# Patient Record
Sex: Male | Born: 1945
Health system: Southern US, Community
[De-identification: ages and names within clinical notes are randomized; demographics above are authoritative.]

## PROBLEM LIST (undated history)

## (undated) DIAGNOSIS — I10 Essential (primary) hypertension: Secondary | ICD-10-CM

## (undated) DIAGNOSIS — D649 Anemia, unspecified: Secondary | ICD-10-CM

## (undated) DIAGNOSIS — R079 Chest pain, unspecified: Secondary | ICD-10-CM

## (undated) DIAGNOSIS — E119 Type 2 diabetes mellitus without complications: Secondary | ICD-10-CM

## (undated) DIAGNOSIS — R0789 Other chest pain: Secondary | ICD-10-CM

## (undated) DIAGNOSIS — E785 Hyperlipidemia, unspecified: Secondary | ICD-10-CM

## (undated) DIAGNOSIS — J309 Allergic rhinitis, unspecified: Secondary | ICD-10-CM

## (undated) DIAGNOSIS — C801 Malignant (primary) neoplasm, unspecified: Secondary | ICD-10-CM

## (undated) DIAGNOSIS — J329 Chronic sinusitis, unspecified: Secondary | ICD-10-CM

## (undated) DIAGNOSIS — D1720 Benign lipomatous neoplasm of skin and subcutaneous tissue of unspecified limb: Secondary | ICD-10-CM

## (undated) HISTORY — PX: COLONOSCOPY: SHX174

## (undated) HISTORY — PX: PROSTATE SURGERY: SHX751

---

## 2005-09-21 ENCOUNTER — Inpatient Hospital Stay: Payer: Self-pay | Admitting: Internal Medicine

## 2005-09-21 ENCOUNTER — Other Ambulatory Visit: Payer: Self-pay

## 2006-03-11 ENCOUNTER — Inpatient Hospital Stay: Payer: Self-pay | Admitting: Urology

## 2006-03-19 ENCOUNTER — Ambulatory Visit: Payer: Self-pay | Admitting: Urology

## 2006-09-04 ENCOUNTER — Ambulatory Visit: Payer: Self-pay | Admitting: Gastroenterology

## 2006-10-06 ENCOUNTER — Ambulatory Visit: Payer: Self-pay

## 2006-12-14 ENCOUNTER — Ambulatory Visit: Payer: Self-pay | Admitting: Radiation Oncology

## 2006-12-21 ENCOUNTER — Ambulatory Visit: Payer: Self-pay | Admitting: Radiation Oncology

## 2007-01-13 ENCOUNTER — Ambulatory Visit: Payer: Self-pay | Admitting: Radiation Oncology

## 2007-02-13 ENCOUNTER — Ambulatory Visit: Payer: Self-pay | Admitting: Radiation Oncology

## 2007-03-15 ENCOUNTER — Ambulatory Visit: Payer: Self-pay | Admitting: Radiation Oncology

## 2007-04-15 ENCOUNTER — Ambulatory Visit: Payer: Self-pay | Admitting: Radiation Oncology

## 2007-06-13 ENCOUNTER — Ambulatory Visit: Payer: Self-pay | Admitting: Radiation Oncology

## 2007-07-15 ENCOUNTER — Ambulatory Visit: Payer: Self-pay | Admitting: Radiation Oncology

## 2007-08-13 ENCOUNTER — Ambulatory Visit: Payer: Self-pay | Admitting: Radiation Oncology

## 2007-12-14 ENCOUNTER — Ambulatory Visit: Payer: Self-pay | Admitting: Radiation Oncology

## 2007-12-30 ENCOUNTER — Ambulatory Visit: Payer: Self-pay | Admitting: Radiation Oncology

## 2008-01-13 ENCOUNTER — Ambulatory Visit: Payer: Self-pay | Admitting: Radiation Oncology

## 2008-12-13 ENCOUNTER — Ambulatory Visit: Payer: Self-pay | Admitting: Radiation Oncology

## 2008-12-27 ENCOUNTER — Ambulatory Visit: Payer: Self-pay | Admitting: Radiation Oncology

## 2009-01-12 ENCOUNTER — Ambulatory Visit: Payer: Self-pay | Admitting: Radiation Oncology

## 2009-04-14 ENCOUNTER — Ambulatory Visit: Payer: Self-pay | Admitting: Radiation Oncology

## 2009-12-13 ENCOUNTER — Ambulatory Visit: Payer: Self-pay | Admitting: Radiation Oncology

## 2009-12-27 ENCOUNTER — Ambulatory Visit: Payer: Self-pay | Admitting: Radiation Oncology

## 2009-12-28 LAB — PSA

## 2010-01-12 ENCOUNTER — Ambulatory Visit: Payer: Self-pay | Admitting: Radiation Oncology

## 2014-02-03 DIAGNOSIS — E119 Type 2 diabetes mellitus without complications: Secondary | ICD-10-CM | POA: Insufficient documentation

## 2014-02-06 ENCOUNTER — Ambulatory Visit: Payer: Self-pay | Admitting: Internal Medicine

## 2015-08-14 DIAGNOSIS — C61 Malignant neoplasm of prostate: Secondary | ICD-10-CM | POA: Insufficient documentation

## 2016-05-09 DIAGNOSIS — E119 Type 2 diabetes mellitus without complications: Secondary | ICD-10-CM | POA: Diagnosis not present

## 2016-05-09 DIAGNOSIS — C61 Malignant neoplasm of prostate: Secondary | ICD-10-CM | POA: Diagnosis not present

## 2016-05-16 DIAGNOSIS — C61 Malignant neoplasm of prostate: Secondary | ICD-10-CM | POA: Diagnosis not present

## 2016-05-16 DIAGNOSIS — D649 Anemia, unspecified: Secondary | ICD-10-CM | POA: Diagnosis not present

## 2016-05-16 DIAGNOSIS — E119 Type 2 diabetes mellitus without complications: Secondary | ICD-10-CM | POA: Diagnosis not present

## 2016-05-16 DIAGNOSIS — E784 Other hyperlipidemia: Secondary | ICD-10-CM | POA: Diagnosis not present

## 2016-05-16 DIAGNOSIS — J329 Chronic sinusitis, unspecified: Secondary | ICD-10-CM | POA: Diagnosis not present

## 2016-05-16 DIAGNOSIS — I10 Essential (primary) hypertension: Secondary | ICD-10-CM | POA: Diagnosis not present

## 2016-06-18 DIAGNOSIS — K006 Disturbances in tooth eruption: Secondary | ICD-10-CM | POA: Diagnosis not present

## 2016-06-30 DIAGNOSIS — K006 Disturbances in tooth eruption: Secondary | ICD-10-CM | POA: Diagnosis not present

## 2016-08-08 DIAGNOSIS — H40033 Anatomical narrow angle, bilateral: Secondary | ICD-10-CM | POA: Diagnosis not present

## 2016-08-11 DIAGNOSIS — D649 Anemia, unspecified: Secondary | ICD-10-CM | POA: Diagnosis not present

## 2016-08-11 DIAGNOSIS — C61 Malignant neoplasm of prostate: Secondary | ICD-10-CM | POA: Diagnosis not present

## 2016-08-11 DIAGNOSIS — E119 Type 2 diabetes mellitus without complications: Secondary | ICD-10-CM | POA: Diagnosis not present

## 2016-08-11 DIAGNOSIS — I10 Essential (primary) hypertension: Secondary | ICD-10-CM | POA: Diagnosis not present

## 2016-08-18 DIAGNOSIS — Z1211 Encounter for screening for malignant neoplasm of colon: Secondary | ICD-10-CM | POA: Diagnosis not present

## 2016-08-18 DIAGNOSIS — E784 Other hyperlipidemia: Secondary | ICD-10-CM | POA: Diagnosis not present

## 2016-08-18 DIAGNOSIS — E119 Type 2 diabetes mellitus without complications: Secondary | ICD-10-CM | POA: Diagnosis not present

## 2016-08-18 DIAGNOSIS — I10 Essential (primary) hypertension: Secondary | ICD-10-CM | POA: Diagnosis not present

## 2016-08-18 DIAGNOSIS — C61 Malignant neoplasm of prostate: Secondary | ICD-10-CM | POA: Diagnosis not present

## 2016-08-18 DIAGNOSIS — Z0000 Encounter for general adult medical examination without abnormal findings: Secondary | ICD-10-CM | POA: Diagnosis not present

## 2016-08-18 DIAGNOSIS — D649 Anemia, unspecified: Secondary | ICD-10-CM | POA: Diagnosis not present

## 2016-08-18 DIAGNOSIS — J329 Chronic sinusitis, unspecified: Secondary | ICD-10-CM | POA: Diagnosis not present

## 2016-11-12 DIAGNOSIS — E119 Type 2 diabetes mellitus without complications: Secondary | ICD-10-CM | POA: Diagnosis not present

## 2016-11-12 DIAGNOSIS — I10 Essential (primary) hypertension: Secondary | ICD-10-CM | POA: Diagnosis not present

## 2016-11-12 DIAGNOSIS — C61 Malignant neoplasm of prostate: Secondary | ICD-10-CM | POA: Diagnosis not present

## 2016-11-19 DIAGNOSIS — C61 Malignant neoplasm of prostate: Secondary | ICD-10-CM | POA: Diagnosis not present

## 2016-11-19 DIAGNOSIS — I10 Essential (primary) hypertension: Secondary | ICD-10-CM | POA: Diagnosis not present

## 2016-11-19 DIAGNOSIS — E784 Other hyperlipidemia: Secondary | ICD-10-CM | POA: Diagnosis not present

## 2016-11-19 DIAGNOSIS — D649 Anemia, unspecified: Secondary | ICD-10-CM | POA: Diagnosis not present

## 2016-11-19 DIAGNOSIS — E119 Type 2 diabetes mellitus without complications: Secondary | ICD-10-CM | POA: Diagnosis not present

## 2017-01-22 DIAGNOSIS — Z1211 Encounter for screening for malignant neoplasm of colon: Secondary | ICD-10-CM | POA: Diagnosis not present

## 2017-01-22 DIAGNOSIS — I10 Essential (primary) hypertension: Secondary | ICD-10-CM | POA: Diagnosis not present

## 2017-02-06 DIAGNOSIS — H40033 Anatomical narrow angle, bilateral: Secondary | ICD-10-CM | POA: Diagnosis not present

## 2017-02-06 DIAGNOSIS — E119 Type 2 diabetes mellitus without complications: Secondary | ICD-10-CM | POA: Diagnosis not present

## 2017-04-21 ENCOUNTER — Encounter: Payer: Self-pay | Admitting: Emergency Medicine

## 2017-04-22 ENCOUNTER — Encounter: Payer: Self-pay | Admitting: *Deleted

## 2017-04-22 ENCOUNTER — Encounter
Admission: RE | Disposition: A | Discharge status: Home / Self Care | Payer: Self-pay | Source: Ambulatory Visit | Attending: Unknown Physician Specialty

## 2017-04-22 ENCOUNTER — Ambulatory Visit: Payer: Medicare HMO | Admitting: Anesthesiology

## 2017-04-22 ENCOUNTER — Ambulatory Visit
Admission: RE | Admit: 2017-04-22 | Discharge: 2017-04-22 | Disposition: A | Discharge status: Home / Self Care | Payer: Medicare HMO | Source: Ambulatory Visit | Attending: Unknown Physician Specialty | Admitting: Unknown Physician Specialty

## 2017-04-22 DIAGNOSIS — Z7984 Long term (current) use of oral hypoglycemic drugs: Secondary | ICD-10-CM | POA: Insufficient documentation

## 2017-04-22 DIAGNOSIS — K648 Other hemorrhoids: Secondary | ICD-10-CM | POA: Diagnosis not present

## 2017-04-22 DIAGNOSIS — Z87891 Personal history of nicotine dependence: Secondary | ICD-10-CM | POA: Diagnosis not present

## 2017-04-22 DIAGNOSIS — D123 Benign neoplasm of transverse colon: Secondary | ICD-10-CM | POA: Insufficient documentation

## 2017-04-22 DIAGNOSIS — Z79899 Other long term (current) drug therapy: Secondary | ICD-10-CM | POA: Diagnosis not present

## 2017-04-22 DIAGNOSIS — Z7982 Long term (current) use of aspirin: Secondary | ICD-10-CM | POA: Diagnosis not present

## 2017-04-22 DIAGNOSIS — E119 Type 2 diabetes mellitus without complications: Secondary | ICD-10-CM | POA: Insufficient documentation

## 2017-04-22 DIAGNOSIS — K635 Polyp of colon: Secondary | ICD-10-CM | POA: Diagnosis not present

## 2017-04-22 DIAGNOSIS — Z1211 Encounter for screening for malignant neoplasm of colon: Secondary | ICD-10-CM | POA: Diagnosis not present

## 2017-04-22 DIAGNOSIS — D122 Benign neoplasm of ascending colon: Secondary | ICD-10-CM | POA: Diagnosis not present

## 2017-04-22 DIAGNOSIS — K640 First degree hemorrhoids: Secondary | ICD-10-CM | POA: Diagnosis not present

## 2017-04-22 DIAGNOSIS — I10 Essential (primary) hypertension: Secondary | ICD-10-CM | POA: Diagnosis not present

## 2017-04-22 DIAGNOSIS — D649 Anemia, unspecified: Secondary | ICD-10-CM | POA: Diagnosis not present

## 2017-04-22 DIAGNOSIS — E785 Hyperlipidemia, unspecified: Secondary | ICD-10-CM | POA: Diagnosis not present

## 2017-04-22 HISTORY — DX: Chest pain, unspecified: R07.9

## 2017-04-22 HISTORY — DX: Allergic rhinitis, unspecified: J30.9

## 2017-04-22 HISTORY — DX: Hyperlipidemia, unspecified: E78.5

## 2017-04-22 HISTORY — DX: Chronic sinusitis, unspecified: J32.9

## 2017-04-22 HISTORY — PX: COLONOSCOPY WITH PROPOFOL: SHX5780

## 2017-04-22 HISTORY — DX: Type 2 diabetes mellitus without complications: E11.9

## 2017-04-22 HISTORY — DX: Anemia, unspecified: D64.9

## 2017-04-22 HISTORY — DX: Essential (primary) hypertension: I10

## 2017-04-22 HISTORY — DX: Other chest pain: R07.89

## 2017-04-22 HISTORY — DX: Benign lipomatous neoplasm of skin and subcutaneous tissue of unspecified limb: D17.20

## 2017-04-22 HISTORY — DX: Malignant (primary) neoplasm, unspecified: C80.1

## 2017-04-22 LAB — GLUCOSE, CAPILLARY: Glucose-Capillary: 115 mg/dL — ABNORMAL HIGH (ref 65–99)

## 2017-04-22 SURGERY — COLONOSCOPY WITH PROPOFOL
Anesthesia: General

## 2017-04-22 MED ORDER — FENTANYL CITRATE (PF) 100 MCG/2ML IJ SOLN
INTRAMUSCULAR | Status: AC
Start: 2017-04-22 — End: 2017-04-22
  Filled 2017-04-22: qty 2

## 2017-04-22 MED ORDER — PROPOFOL 10 MG/ML IV BOLUS
INTRAVENOUS | Status: AC
Start: 2017-04-22 — End: 2017-04-22
  Filled 2017-04-22: qty 20

## 2017-04-22 MED ORDER — LIDOCAINE HCL (CARDIAC) 20 MG/ML IV SOLN
INTRAVENOUS | Status: DC | PRN
  Administered 2017-04-22: 30 mg via INTRAVENOUS

## 2017-04-22 MED ORDER — LIDOCAINE HCL (PF) 2 % IJ SOLN
INTRAMUSCULAR | Status: AC
  Filled 2017-04-22: qty 10

## 2017-04-22 MED ORDER — FENTANYL CITRATE (PF) 100 MCG/2ML IJ SOLN: 25.0000 ug | INTRAMUSCULAR | Status: DC | PRN

## 2017-04-22 MED ORDER — ONDANSETRON HCL 4 MG/2ML IJ SOLN: 4.0000 mg | Freq: Once | INTRAMUSCULAR | Status: DC | PRN

## 2017-04-22 MED ORDER — PROPOFOL 500 MG/50ML IV EMUL
INTRAVENOUS | Status: AC
  Filled 2017-04-22: qty 50

## 2017-04-22 MED ORDER — SODIUM CHLORIDE 0.9 % IV SOLN
INTRAVENOUS | Status: DC
  Administered 2017-04-22: 08:00:00 via INTRAVENOUS

## 2017-04-22 MED ORDER — PROPOFOL 500 MG/50ML IV EMUL
INTRAVENOUS | Status: DC | PRN
  Administered 2017-04-22: 140 ug/kg/min via INTRAVENOUS

## 2017-04-22 MED ORDER — EPHEDRINE SULFATE 50 MG/ML IJ SOLN
INTRAMUSCULAR | Status: AC
  Filled 2017-04-22: qty 1

## 2017-04-22 MED ORDER — PROPOFOL 10 MG/ML IV BOLUS
INTRAVENOUS | Status: DC | PRN
  Administered 2017-04-22: 100 mg via INTRAVENOUS

## 2017-04-22 MED ORDER — PHENYLEPHRINE HCL 10 MG/ML IJ SOLN
INTRAMUSCULAR | Status: AC
  Filled 2017-04-22: qty 1

## 2017-04-22 MED ORDER — SODIUM CHLORIDE 0.9 % IJ SOLN
INTRAMUSCULAR | Status: AC
  Filled 2017-04-22: qty 10

## 2017-04-22 MED ORDER — FENTANYL CITRATE (PF) 100 MCG/2ML IJ SOLN
INTRAMUSCULAR | Status: DC | PRN
  Administered 2017-04-22 (×2): 50 ug via INTRAVENOUS

## 2017-04-22 NOTE — Anesthesia Preprocedure Evaluation (Signed)
Anesthesia Evaluation  Patient identified by MRN, date of birth, ID band Patient awake    Reviewed: Allergy & Precautions, NPO status , Patient's Chart, lab work & pertinent test results  Airway Mallampati: II       Dental   Pulmonary former smoker,    Pulmonary exam normal        Cardiovascular hypertension, Pt. on medications Normal cardiovascular exam     Neuro/Psych negative neurological ROS  negative psych ROS   GI/Hepatic negative GI ROS, Neg liver ROS,   Endo/Other  diabetes, Well Controlled, Type 2, Oral Hypoglycemic Agents  Renal/GU negative Renal ROS  negative genitourinary   Musculoskeletal negative musculoskeletal ROS (+)   Abdominal Normal abdominal exam  (+)   Peds negative pediatric ROS (+)  Hematology  (+) anemia ,   Anesthesia Other Findings   Reproductive/Obstetrics                             Anesthesia Physical Anesthesia Plan  ASA: III  Anesthesia Plan: General   Post-op Pain Management:    Induction: Intravenous  PONV Risk Score and Plan:   Airway Management Planned: Nasal Cannula  Additional Equipment:   Intra-op Plan:   Post-operative Plan:   Informed Consent: I have reviewed the patients History and Physical, chart, labs and discussed the procedure including the risks, benefits and alternatives for the proposed anesthesia with the patient or authorized representative who has indicated his/her understanding and acceptance.   Dental advisory given  Plan Discussed with: CRNA and Surgeon  Anesthesia Plan Comments:         Anesthesia Quick Evaluation

## 2017-04-22 NOTE — Op Note (Signed)
[facility] Gastroenterology Patient Name: Nicholas Schneider Procedure Date: 04/22/2017 7:29 AM MRN: [id number] Account #: [id number] Date of Birth: 1946-04-08 Admit Type: Outpatient Age: 72 Room: [facility] ENDO ROOM 4 Gender: Male Note Status: Finalized Procedure:            Colonoscopy Indications:          Screening for colorectal malignant neoplasm Providers:            Manya Silvas, MD Referring MD:         Ramonita Lab, MD (Referring MD) Medicines:            Propofol per Anesthesia Complications:        No immediate complications. Procedure:            Pre-Anesthesia Assessment:                       - After reviewing the risks and benefits, the patient                        was deemed in satisfactory condition to undergo the                        procedure.                       After obtaining informed consent, the colonoscope was                        passed under direct vision. Throughout the procedure,                        the patient's blood pressure, pulse, and oxygen                        saturations were monitored continuously. The                        Colonoscope was introduced through the anus and                        advanced to the the cecum, identified by appendiceal                        orifice and ileocecal valve. The colonoscopy was                        performed without difficulty. The patient tolerated the                        procedure well. The quality of the bowel preparation                        was good. Findings:      A diminutive polyp was found in the ascending colon. The polyp was       sessile. The polyp was removed with a jumbo cold forceps. Resection and       retrieval were complete.      A small polyp was found in the transverse colon. The polyp was sessile.       The polyp was removed with a cold snare. Resection and retrieval were  complete.      Internal hemorrhoids were found during endoscopy. The  hemorrhoids were       small and Grade I (internal hemorrhoids that do not prolapse).      The exam was otherwise without abnormality. Impression:           - One diminutive polyp in the ascending colon, removed                        with a jumbo cold forceps. Resected and retrieved.                       - One small polyp in the transverse colon, removed with                        a cold snare. Resected and retrieved.                       - Internal hemorrhoids.                       - The examination was otherwise normal. Recommendation:       - Await pathology results. Manya Silvas, MD 04/22/2017 8:03:52 AM This report has been signed electronically. Number of Addenda: 0 Note Initiated On: 04/22/2017 7:29 AM Scope Withdrawal Time: 0 hours 11 minutes 32 seconds  Total Procedure Duration: 0 hours 18 minutes 14 seconds       [facility]

## 2017-04-22 NOTE — Anesthesia Postprocedure Evaluation (Signed)
Anesthesia Post Note  Patient: Nicholas Schneider  Procedure(s) Performed: COLONOSCOPY WITH PROPOFOL (N/A )  Patient location during evaluation: PACU Anesthesia Type: General Level of consciousness: awake and alert and oriented Pain management: pain level controlled Vital Signs Assessment: post-procedure vital signs reviewed and stable Respiratory status: spontaneous breathing Cardiovascular status: blood pressure returned to baseline Anesthetic complications: no     Last Vitals:  Vitals:   04/22/17 0826 04/22/17 0836  BP: (!) 154/88 (!) 156/85  Pulse: (!) 58 63  Resp: 20 20  Temp:    SpO2: 99% 99%    Last Pain:  Vitals:   04/22/17 0806  TempSrc: Tympanic                 [name]

## 2017-04-22 NOTE — Transfer of Care (Signed)
Immediate Anesthesia Transfer of Care Note  Patient: Nicholas Schneider  Procedure(s) Performed: COLONOSCOPY WITH PROPOFOL (N/A )  Patient Location: PACU and Endoscopy Unit  Anesthesia Type:General  Level of Consciousness: awake, oriented and patient cooperative  Airway & Oxygen Therapy: Patient Spontanous Breathing and Patient connected to nasal cannula oxygen  Post-op Assessment: Report given to RN and Post -op Vital signs reviewed and stable  Post vital signs: Reviewed and stable  Last Vitals:  Vitals:   04/22/17 0705  BP: (!) 165/82  Pulse: 73  Resp: 18  Temp: (!) 36.1 C  SpO2: 97%    Last Pain:  Vitals:   04/22/17 0705  TempSrc: Tympanic         Complications: No apparent anesthesia complications

## 2017-04-22 NOTE — Anesthesia Post-op Follow-up Note (Signed)
Anesthesia QCDR form completed.        

## 2017-04-22 NOTE — H&P (Signed)
Primary Care Physician:  Adin Hector, MD Primary Gastroenterologist:  Dr. Vira Agar  Pre-Procedure History & Physical: HPI:  Nicholas Schneider is a 72 y.o. male is here for an colonoscopy.   Past Medical History:  Diagnosis Date  . Allergic rhinitis   . Anemia   . Cancer ([place])   . Chest pain of uncertain etiology   . Diabetes mellitus without complication ([place])   . Hyperlipidemia   . Hypertension   . Lipoma of arm   . Recurrent sinusitis     Past Surgical History:  Procedure Laterality Date  . COLONOSCOPY    . PROSTATE SURGERY      Prior to Admission medications   Medication Sig Start Date End Date Taking? Authorizing Provider  aspirin 81 MG EC tablet Take 81 mg by mouth daily. Swallow whole.   Yes [provider]  azelastine (ASTELIN) 0.1 % nasal spray Place 1 spray into both nostrils 2 (two) times daily. Use in each nostril as directed   Yes [provider]  cloNIDine (CATAPRES) 0.1 MG tablet Take 0.1 mg by mouth 2 (two) times daily.   Yes [provider]  glipiZIDE (GLUCOTROL) 10 MG tablet Take 10 mg by mouth daily.   Yes [provider]  hydrochlorothiazide (HYDRODIURIL) 25 MG tablet Take 25 mg by mouth daily.   Yes [provider]  metFORMIN (GLUCOPHAGE) 1000 MG tablet Take 1,000 mg by mouth 2 (two) times daily with a meal.   Yes [provider]  Multiple Vitamin (MULTIVITAMIN) tablet Take 1 tablet by mouth daily.   Yes [provider]  quinapril (ACCUPRIL) 20 MG tablet Take 20 mg by mouth 2 (two) times daily.   Yes [provider]  rosuvastatin (CRESTOR) 10 MG tablet Take 10 mg by mouth daily.   Yes [provider]  simvastatin (ZOCOR) 20 MG tablet Take 20 mg by mouth daily.   Yes [provider]  verapamil (CALAN-SR) 240 MG CR tablet Take 240 mg by mouth at bedtime.   Yes [provider]  azithromycin (ZITHROMAX) 250 MG tablet Take 500 mg by mouth daily.    [provider]  ranitidine (ZANTAC) 150 MG tablet Take 150 mg by mouth daily as needed for heartburn.    [provider]    Allergies as of 03/02/2017  . (Not on File)    History reviewed. No pertinent family history.  Social History   Socioeconomic History  . Marital status: Married    Spouse name: Not on file  . Number of children: Not on file  . Years of education: Not on file  . Highest education level: Not on file  Social Needs  . Financial resource strain: Not on file  . Food insecurity - worry: Not on file  . Food insecurity - inability: Not on file  . Transportation needs - medical: Not on file  . Transportation needs - non-medical: Not on file  Occupational History  . Not on file  Tobacco Use  . Smoking status: Former Smoker    Last attempt to quit: 04/22/1991    Years since quitting: 26.0  . Smokeless tobacco: Never Used  Substance and Sexual Activity  . Alcohol use: No    Frequency: Never  . Drug use: No  . Sexual activity: Not on file  Other Topics Concern  . Not on file  Social History Narrative  . Not on file    Review of Systems: See HPI, otherwise  negative ROS  Physical Exam: BP (!) 165/82   Pulse 73   Temp (!) 97 F (36.1 C) (Tympanic)   Resp 18   Ht 5' 4.5" (1.638 m)   Wt 96.6 kg (213 lb)   SpO2 97%   BMI 36.00 kg/m  General:   Alert,  pleasant and cooperative in NAD Head:  Normocephalic and atraumatic. Neck:  Supple; no masses or thyromegaly. Lungs:  Clear throughout to auscultation.    Heart:  Regular rate and rhythm. Abdomen:  Soft, nontender and nondistended. Normal bowel sounds, without guarding, and without rebound.   Neurologic:  Alert and  oriented x4;  grossly normal neurologically.  Impression/Plan: Nicholas Schneider is here for an colonoscopy to be performed for colon cancer screening.  Risks, benefits, limitations, and alternatives regarding  colonoscopy have been reviewed with the patient.  Questions have been  answered.  All parties agreeable.   Gaylyn Cheers, MD  04/22/2017, 7:25 AM

## 2017-04-23 ENCOUNTER — Encounter: Payer: Self-pay | Admitting: Unknown Physician Specialty

## 2017-04-23 LAB — SURGICAL PATHOLOGY

## 2017-05-15 DIAGNOSIS — H40033 Anatomical narrow angle, bilateral: Secondary | ICD-10-CM | POA: Diagnosis not present

## 2017-05-18 DIAGNOSIS — C61 Malignant neoplasm of prostate: Secondary | ICD-10-CM | POA: Diagnosis not present

## 2017-05-18 DIAGNOSIS — E7849 Other hyperlipidemia: Secondary | ICD-10-CM | POA: Diagnosis not present

## 2017-05-18 DIAGNOSIS — E119 Type 2 diabetes mellitus without complications: Secondary | ICD-10-CM | POA: Diagnosis not present

## 2017-05-18 DIAGNOSIS — D649 Anemia, unspecified: Secondary | ICD-10-CM | POA: Diagnosis not present

## 2017-05-18 DIAGNOSIS — I10 Essential (primary) hypertension: Secondary | ICD-10-CM | POA: Diagnosis not present

## 2017-05-25 DIAGNOSIS — E119 Type 2 diabetes mellitus without complications: Secondary | ICD-10-CM | POA: Diagnosis not present

## 2017-05-25 DIAGNOSIS — D649 Anemia, unspecified: Secondary | ICD-10-CM | POA: Diagnosis not present

## 2017-05-25 DIAGNOSIS — E7849 Other hyperlipidemia: Secondary | ICD-10-CM | POA: Diagnosis not present

## 2017-05-25 DIAGNOSIS — C61 Malignant neoplasm of prostate: Secondary | ICD-10-CM | POA: Diagnosis not present

## 2017-05-25 DIAGNOSIS — I10 Essential (primary) hypertension: Secondary | ICD-10-CM | POA: Diagnosis not present

## 2017-05-28 ENCOUNTER — Encounter: Payer: Self-pay | Admitting: Urology

## 2017-05-28 ENCOUNTER — Ambulatory Visit: Payer: Medicare HMO | Admitting: Urology

## 2017-05-28 VITALS — BP 174/75 | HR 82 | Ht 64.0 in | Wt 213.0 lb

## 2017-05-28 DIAGNOSIS — R972 Elevated prostate specific antigen [PSA]: Secondary | ICD-10-CM

## 2017-05-28 DIAGNOSIS — C61 Malignant neoplasm of prostate: Secondary | ICD-10-CM | POA: Diagnosis not present

## 2017-05-28 NOTE — Progress Notes (Signed)
05/28/2017 10:43 AM   Nicholas Schneider 03-Jan-1946 [id number]  Referring provider: Adin Hector, MD [facility] [place], [place] [address]  Chief Complaint  Patient presents with  . Elevated PSA    New patient    HPI: Nicholas Schneider is a 72 year old male with a history of prostate cancer seen at the request of Dr. Caryl Comes for a rising PSA.  His previous records are not available to review however I saw him in 2007/2008 and he underwent a radical prostatectomy.  He had positive margins and a detectable PSA and underwent radiation therapy approximately 3 months after his surgery.  He had a PSA nadir of <0.1 that persisted till at least 2011. A PSA in April 2016 was 0.11 and this has slowly risen and was most recently 0.36 on 05/18/2017.  He denies lower urinary tract symptoms or gross hematuria.  He denies flank, abdominal, pelvic or scrotal pain.  He has no bony pain.  PMH: Past Medical History:  Diagnosis Date  . Allergic rhinitis   . Anemia   . Cancer ([place])   . Chest pain of uncertain etiology   . Diabetes mellitus without complication ([place])   . Hyperlipidemia   . Hypertension   . Lipoma of arm   . Recurrent sinusitis     Surgical History: Past Surgical History:  Procedure Laterality Date  . COLONOSCOPY    . COLONOSCOPY WITH PROPOFOL N/A 04/22/2017   Procedure: COLONOSCOPY WITH PROPOFOL;  Surgeon: Manya Silvas, MD;  Location: [facility] ENDOSCOPY;  Service: Endoscopy;  Laterality: N/A;  . PROSTATE SURGERY      Home Medications:  Allergies as of 05/28/2017   No Known Allergies     Medication List        Accurate as of 05/28/17 10:43 AM. Always use your most recent med list.          aspirin 81 MG EC tablet Take 81 mg by mouth daily. Swallow whole.   azelastine 0.1 % nasal spray Commonly known as:  ASTELIN Place 1 spray into both nostrils 2 (two) times daily. Use in each nostril as directed   benzonatate 200 MG capsule Commonly known  as:  TESSALON TAKE 1 CAPSULE BY MOUTH EVERY DAY 3 TIMES A DAY AS NEEDED FOR COUGH   cloNIDine 0.1 MG tablet Commonly known as:  CATAPRES Take 0.1 mg by mouth 2 (two) times daily.   glipiZIDE 10 MG tablet Commonly known as:  GLUCOTROL Take 10 mg by mouth daily.   hydrochlorothiazide 25 MG tablet Commonly known as:  HYDRODIURIL Take 25 mg by mouth daily.   metFORMIN 1000 MG tablet Commonly known as:  GLUCOPHAGE Take 1,000 mg by mouth 2 (two) times daily with a meal.   multivitamin tablet Take 1 tablet by mouth daily.   quinapril 20 MG tablet Commonly known as:  ACCUPRIL Take 20 mg by mouth 2 (two) times daily.   ranitidine 150 MG tablet Commonly known as:  ZANTAC Take 150 mg by mouth daily as needed for heartburn.   rosuvastatin 10 MG tablet Commonly known as:  CRESTOR Take 10 mg by mouth daily.   simvastatin 20 MG tablet Commonly known as:  ZOCOR Take 20 mg by mouth daily.   verapamil 240 MG CR tablet Commonly known as:  CALAN-SR Take 240 mg by mouth at bedtime.       Allergies: No Known Allergies  Family History: No family history on file.  Social History:  reports that he quit smoking about 26 years ago. he has never used smokeless tobacco. He reports that he does not drink alcohol or use drugs.  ROS: UROLOGY Frequent Urination?: No Hard to postpone urination?: No Burning/pain with urination?: No Get up at night to urinate?: No Leakage of urine?: Yes Urine stream starts and stops?: No Trouble starting stream?: No Do you have to strain to urinate?: No Blood in urine?: No Urinary tract infection?: No Sexually transmitted disease?: No Injury to kidneys or bladder?: No Painful intercourse?: No Weak stream?: No Erection problems?: No Penile pain?: No  Gastrointestinal Nausea?: No Vomiting?: No Indigestion/heartburn?: No Diarrhea?: No Constipation?: No  Constitutional Fever: No Night sweats?: No Weight loss?: No Fatigue?: No  Skin Skin  rash/lesions?: No Itching?: No  Eyes Blurred vision?: No Double vision?: No  Ears/Nose/Throat Sore throat?: No Sinus problems?: No  Hematologic/Lymphatic Swollen glands?: No Easy bruising?: No  Cardiovascular Leg swelling?: No Chest pain?: No  Respiratory Cough?: No Shortness of breath?: No  Endocrine Excessive thirst?: No  Musculoskeletal Back pain?: No Joint pain?: No  Neurological Headaches?: No Dizziness?: No  Psychologic Depression?: No Anxiety?: No  Physical Exam: BP (!) 174/75   Pulse 82   Ht 5\' 4"  (1.626 m)   Wt 213 lb (96.6 kg)   BMI 36.56 kg/m   Constitutional:  Alert and oriented, No acute distress. HEENT: [place] AT, moist mucus membranes.  Trachea midline, no masses. Cardiovascular: No clubbing, cyanosis, or edema. Respiratory: Normal respiratory effort, no increased work of breathing. GI: Abdomen is soft, nontender, nondistended, no abdominal masses GU: No CVA tenderness. Skin: No rashes, bruises or suspicious lesions. Lymph: No cervical or inguinal adenopathy. Neurologic: Grossly intact, no focal deficits, moving all 4 extremities. Psychiatric: Normal mood and affect.   Assessment & Plan:  72 year old male with a history of prostate cancer status post radical prostatectomy and adjuvant radiotherapy.  He has a slowly rising PSA.  He is asymptomatic.  Will schedule a CT of the pelvis with contrast to evaluate for objective evidence of recurrent disease.  If negative we discussed options of surveillance versus starting hormonal therapy.   1. Elevated PSA  2. Prostate cancer [facility])  - CT Abdomen Pelvis W Contrast; Future   Return in about 6 months (around 11/25/2017) for Recheck, PSA.  Abbie Sons, [place] [address], [place], [place] [address] [phone]

## 2017-05-29 ENCOUNTER — Telehealth: Payer: Self-pay | Admitting: Urology

## 2017-05-29 NOTE — Telephone Encounter (Signed)
I just wanted to give you a heads up on this Dr. Bernardo Heater ordered a CT scan for this patient but because he is not in the database for her insurance I had to get it approved under your name. The report may get sent to you for review, if you get it can you please forward it over to him? I will have to call and try and get him added to the website for her insurance.   Thanks, Sharyn Lull

## 2017-06-11 ENCOUNTER — Ambulatory Visit
Admission: RE | Admit: 2017-06-11 | Discharge: 2017-06-11 | Disposition: A | Discharge status: Home / Self Care | Payer: Medicare HMO | Source: Ambulatory Visit | Attending: Urology | Admitting: Urology

## 2017-06-11 DIAGNOSIS — D4412 Neoplasm of uncertain behavior of left adrenal gland: Secondary | ICD-10-CM | POA: Diagnosis not present

## 2017-06-11 DIAGNOSIS — C61 Malignant neoplasm of prostate: Secondary | ICD-10-CM | POA: Insufficient documentation

## 2017-06-11 DIAGNOSIS — E278 Other specified disorders of adrenal gland: Secondary | ICD-10-CM | POA: Insufficient documentation

## 2017-06-11 DIAGNOSIS — Z9079 Acquired absence of other genital organ(s): Secondary | ICD-10-CM | POA: Diagnosis not present

## 2017-06-11 MED ORDER — IOPAMIDOL (ISOVUE-300) INJECTION 61%
100.0000 mL | Freq: Once | INTRAVENOUS | Status: AC | PRN
  Administered 2017-06-11: 100 mL via INTRAVENOUS

## 2017-06-16 ENCOUNTER — Telehealth: Payer: Self-pay

## 2017-06-16 NOTE — Telephone Encounter (Signed)
-----   Message from Abbie Sons, MD sent at 06/12/2017  9:35 AM EST ----- CT scan showed no evidence of recurrent disease or enlarged lymph nodes.  Would recommend continued monitoring with a follow-up PSA/office visit June 2019

## 2017-06-16 NOTE — Telephone Encounter (Signed)
Letter sent.

## 2017-08-25 DIAGNOSIS — D649 Anemia, unspecified: Secondary | ICD-10-CM | POA: Diagnosis not present

## 2017-08-25 DIAGNOSIS — E119 Type 2 diabetes mellitus without complications: Secondary | ICD-10-CM | POA: Diagnosis not present

## 2017-09-01 DIAGNOSIS — D649 Anemia, unspecified: Secondary | ICD-10-CM | POA: Diagnosis not present

## 2017-09-01 DIAGNOSIS — B351 Tinea unguium: Secondary | ICD-10-CM | POA: Diagnosis not present

## 2017-09-01 DIAGNOSIS — E119 Type 2 diabetes mellitus without complications: Secondary | ICD-10-CM | POA: Diagnosis not present

## 2017-09-01 DIAGNOSIS — I10 Essential (primary) hypertension: Secondary | ICD-10-CM | POA: Diagnosis not present

## 2017-09-01 DIAGNOSIS — E7849 Other hyperlipidemia: Secondary | ICD-10-CM | POA: Diagnosis not present

## 2017-09-01 DIAGNOSIS — Z0000 Encounter for general adult medical examination without abnormal findings: Secondary | ICD-10-CM | POA: Diagnosis not present

## 2017-09-01 DIAGNOSIS — C61 Malignant neoplasm of prostate: Secondary | ICD-10-CM | POA: Diagnosis not present

## 2017-09-01 DIAGNOSIS — E1169 Type 2 diabetes mellitus with other specified complication: Secondary | ICD-10-CM

## 2017-10-17 DIAGNOSIS — L298 Other pruritus: Secondary | ICD-10-CM | POA: Diagnosis not present

## 2017-10-18 ENCOUNTER — Emergency Department
Admission: EM | Admit: 2017-10-18 | Discharge: 2017-10-18 | Disposition: A | Discharge status: Home / Self Care | Payer: Medicare HMO | Attending: Emergency Medicine | Admitting: Emergency Medicine

## 2017-10-18 ENCOUNTER — Other Ambulatory Visit: Payer: Self-pay

## 2017-10-18 DIAGNOSIS — Z87891 Personal history of nicotine dependence: Secondary | ICD-10-CM | POA: Insufficient documentation

## 2017-10-18 DIAGNOSIS — T783XXA Angioneurotic edema, initial encounter: Secondary | ICD-10-CM | POA: Insufficient documentation

## 2017-10-18 DIAGNOSIS — I10 Essential (primary) hypertension: Secondary | ICD-10-CM | POA: Diagnosis not present

## 2017-10-18 DIAGNOSIS — E119 Type 2 diabetes mellitus without complications: Secondary | ICD-10-CM | POA: Insufficient documentation

## 2017-10-18 DIAGNOSIS — Z859 Personal history of malignant neoplasm, unspecified: Secondary | ICD-10-CM | POA: Diagnosis not present

## 2017-10-18 LAB — CBC WITH DIFFERENTIAL/PLATELET
Basophils Absolute: 0.1 10*3/uL (ref 0–0.1)
Basophils Relative: 1 %
Eosinophils Absolute: 0.2 10*3/uL (ref 0–0.7)
Eosinophils Relative: 3 %
HCT: 42.8 % (ref 40.0–52.0)
HEMOGLOBIN: 14.7 g/dL (ref 13.0–18.0)
LYMPHS ABS: 2.2 10*3/uL (ref 1.0–3.6)
LYMPHS PCT: 31 %
MCH: 30.5 pg (ref 26.0–34.0)
MCHC: 34.4 g/dL (ref 32.0–36.0)
MCV: 88.5 fL (ref 80.0–100.0)
Monocytes Absolute: 0.8 10*3/uL (ref 0.2–1.0)
Monocytes Relative: 11 %
NEUTROS PCT: 54 %
Neutro Abs: 3.9 10*3/uL (ref 1.4–6.5)
Platelets: 288 10*3/uL (ref 150–440)
RBC: 4.84 MIL/uL (ref 4.40–5.90)
RDW: 14.1 % (ref 11.5–14.5)
WBC: 7.1 10*3/uL (ref 3.8–10.6)

## 2017-10-18 LAB — COMPREHENSIVE METABOLIC PANEL
ALK PHOS: 57 U/L (ref 38–126)
ALT: 20 U/L (ref 0–44)
AST: 21 U/L (ref 15–41)
Albumin: 4.2 g/dL (ref 3.5–5.0)
Anion gap: 7 (ref 5–15)
BUN: 16 mg/dL (ref 8–23)
CALCIUM: 9.1 mg/dL (ref 8.9–10.3)
CO2: 29 mmol/L (ref 22–32)
CREATININE: 0.86 mg/dL (ref 0.61–1.24)
Chloride: 102 mmol/L (ref 98–111)
Glucose, Bld: 188 mg/dL — ABNORMAL HIGH (ref 70–99)
Potassium: 3.8 mmol/L (ref 3.5–5.1)
Sodium: 138 mmol/L (ref 135–145)
Total Bilirubin: 0.6 mg/dL (ref 0.3–1.2)
Total Protein: 7.6 g/dL (ref 6.5–8.1)

## 2017-10-18 LAB — TROPONIN I: Troponin I: <0.03 ng/mL (ref <0.03)

## 2017-10-18 MED ORDER — PREDNISONE 10 MG (21) PO TBPK: ORAL_TABLET | Freq: Every day | ORAL | 0 refills | Status: DC

## 2017-10-18 MED ORDER — DIPHENHYDRAMINE HCL 50 MG/ML IJ SOLN
25.0000 mg | Freq: Once | INTRAMUSCULAR | Status: AC
  Administered 2017-10-18: 25 mg via INTRAVENOUS
  Filled 2017-10-18: qty 1

## 2017-10-18 MED ORDER — IPRATROPIUM-ALBUTEROL 0.5-2.5 (3) MG/3ML IN SOLN
3.0000 mL | Freq: Once | RESPIRATORY_TRACT | Status: AC
  Administered 2017-10-18: 3 mL via RESPIRATORY_TRACT
  Filled 2017-10-18: qty 3

## 2017-10-18 NOTE — ED Triage Notes (Signed)
Pt reports noticing a couple areas of swelling and itching on the top of his head a week ago, pt went to the walk in clinic yesterday for large hive to the left upper arm and was prescribed topical benadryl, pt woke up this am with the left side of his tongue swollen and left side of throat feeling swollen, pt denies taking any medication this am

## 2017-10-18 NOTE — ED Provider Notes (Signed)
[facility] Emergency Department Provider Note       Time seen: ----------------------------------------- 8:06 AM on 10/18/2017 -----------------------------------------   I have reviewed the triage vital signs and the nursing notes.  HISTORY   Chief Complaint Angioedema    HPI Nicholas Schneider is a 72 y.o. male with a history of anemia, cancer, diabetes, hyperlipidemia, hypertension who presents to the ED for areas of swelling and itching on the top of his head a week ago.  Patient went to the walk-in clinic yesterday for some hives to his left upper arm.  He was prescribed topical Benadryl.  He reports he woke up this morning with left side of his tongue being swollen left side of his throat feeling swollen.  Elastic all of his medications yesterday.  He does take an ACE inhibitor for hypertension.  Past Medical History:  Diagnosis Date  . Allergic rhinitis   . Anemia   . Cancer ([place])   . Chest pain of uncertain etiology   . Diabetes mellitus without complication ([place])   . Hyperlipidemia   . Hypertension   . Lipoma of arm   . Recurrent sinusitis     There are no active problems to display for this patient.   Past Surgical History:  Procedure Laterality Date  . COLONOSCOPY    . COLONOSCOPY WITH PROPOFOL N/A 04/22/2017   Procedure: COLONOSCOPY WITH PROPOFOL;  Surgeon: Manya Silvas, MD;  Location: [facility] ENDOSCOPY;  Service: Endoscopy;  Laterality: N/A;  . PROSTATE SURGERY      Allergies Patient has no known allergies.  Social History Social History   Tobacco Use  . Smoking status: Former Smoker    Last attempt to quit: 04/22/1991    Years since quitting: 26.5  . Smokeless tobacco: Never Used  Substance Use Topics  . Alcohol use: No    Frequency: Never  . Drug use: No   Review of Systems Constitutional: Negative for fever. ENT: Positive for tongue swelling Cardiovascular: Negative for chest pain. Respiratory: Negative for shortness of  breath. Gastrointestinal: Negative for abdominal pain, vomiting and diarrhea. Musculoskeletal: Negative for back pain. Skin: Positive for hives and itching Neurological: Negative for headaches, focal weakness or numbness.  All systems negative/normal/unremarkable except as stated in the HPI  ____________________________________________   PHYSICAL EXAM:  VITAL SIGNS: ED Triage Vitals  Enc Vitals Group     BP 10/18/17 0757 (!) 164/92     Pulse Rate 10/18/17 0758 79     Resp --      Temp 10/18/17 0800 98.5 F (36.9 C)     Temp Source 10/18/17 0800 Oral     SpO2 10/18/17 0758 95 %     Weight 10/18/17 0800 214 lb (97.1 kg)     Height 10/18/17 0800 5\' 4"  (1.626 m)     Head Circumference --      Peak Flow --      Pain Score 10/18/17 0800 5     Pain Loc --      Pain Edu? --      Excl. in [place]? --    Constitutional: Alert and oriented. Well appearing and in no distress. Eyes: Conjunctivae are normal. Normal extraocular movements. ENT   Head: Normocephalic and atraumatic.   Nose: No congestion/rhinnorhea.   Mouth/Throat: Mucous membranes are moist.  Mild left sided tongue swelling is noted.   Neck: No stridor. Cardiovascular: Normal rate, regular rhythm. No murmurs, rubs, or gallops. Respiratory: Normal respiratory effort without tachypnea nor retractions.  Breath sounds are clear and equal bilaterally. No wheezes/rales/rhonchi. Gastrointestinal: Soft and nontender. Normal bowel sounds Musculoskeletal: Nontender with normal range of motion in extremities. No lower extremity tenderness nor edema. Neurologic:  Normal speech and language. No gross focal neurologic deficits are appreciated.  Skin: Small area of erythema with likely insect envenomation in the left upper arm, normal-appearing scalp Psychiatric: Mood and affect are normal. Speech and behavior are normal.  ____________________________________________  ED COURSE:  As part of my medical decision making, I  reviewed the following data within the [place] History obtained from family if available, nursing notes, old chart and ekg, as well as notes from prior ED visits. Patient presented for likely angioedema, we will assess with labs, give IV Benadryl and steroids and reevaluate.   Procedures ____________________________________________   LABS (pertinent positives/negatives)  Labs Reviewed  COMPREHENSIVE METABOLIC PANEL - Abnormal; Notable for the following components:      Result Value   Glucose, Bld 188 (*)    All other components within normal limits  CBC WITH DIFFERENTIAL/PLATELET  TROPONIN I   ____________________________________________  DIFFERENTIAL DIAGNOSIS   Angioedema, allergic reaction  FINAL ASSESSMENT AND PLAN  Angioedema   Plan: The patient had presented for likely ACE inhibitor induced angioedema.  He was given IV Benadryl and steroids.  Patient's labs were unremarkable, he will be discharged on steroids and advised to stop taking his ACE inhibitor and discuss alternative antihypertensive therapies with his doctor.   Laurence Aly, MD   Note: This note was generated in part or whole with voice recognition software. Voice recognition is usually quite accurate but there are transcription errors that can and very often do occur. I apologize for any typographical errors that were not detected and corrected.     Earleen Newport, MD 10/18/17 [phone]

## 2017-10-19 DIAGNOSIS — T783XXA Angioneurotic edema, initial encounter: Secondary | ICD-10-CM | POA: Insufficient documentation

## 2017-10-20 DIAGNOSIS — C61 Malignant neoplasm of prostate: Secondary | ICD-10-CM | POA: Diagnosis not present

## 2017-10-20 DIAGNOSIS — T783XXD Angioneurotic edema, subsequent encounter: Secondary | ICD-10-CM | POA: Diagnosis not present

## 2017-10-20 DIAGNOSIS — E119 Type 2 diabetes mellitus without complications: Secondary | ICD-10-CM | POA: Diagnosis not present

## 2017-10-20 DIAGNOSIS — T783XXS Angioneurotic edema, sequela: Secondary | ICD-10-CM | POA: Diagnosis not present

## 2017-10-20 DIAGNOSIS — I10 Essential (primary) hypertension: Secondary | ICD-10-CM | POA: Diagnosis not present

## 2017-10-22 DIAGNOSIS — I10 Essential (primary) hypertension: Secondary | ICD-10-CM | POA: Diagnosis not present

## 2017-10-22 DIAGNOSIS — L508 Other urticaria: Secondary | ICD-10-CM | POA: Diagnosis not present

## 2017-10-22 DIAGNOSIS — T783XXD Angioneurotic edema, subsequent encounter: Secondary | ICD-10-CM | POA: Diagnosis not present

## 2017-10-22 DIAGNOSIS — E7849 Other hyperlipidemia: Secondary | ICD-10-CM | POA: Diagnosis not present

## 2017-10-22 DIAGNOSIS — D649 Anemia, unspecified: Secondary | ICD-10-CM | POA: Diagnosis not present

## 2017-10-22 DIAGNOSIS — E1169 Type 2 diabetes mellitus with other specified complication: Secondary | ICD-10-CM | POA: Diagnosis not present

## 2017-10-22 DIAGNOSIS — C61 Malignant neoplasm of prostate: Secondary | ICD-10-CM | POA: Diagnosis not present

## 2017-10-22 DIAGNOSIS — E119 Type 2 diabetes mellitus without complications: Secondary | ICD-10-CM | POA: Diagnosis not present

## 2017-11-20 ENCOUNTER — Other Ambulatory Visit: Payer: Self-pay

## 2017-11-20 ENCOUNTER — Other Ambulatory Visit: Payer: Medicare HMO

## 2017-11-20 DIAGNOSIS — R972 Elevated prostate specific antigen [PSA]: Secondary | ICD-10-CM

## 2017-11-20 DIAGNOSIS — C61 Malignant neoplasm of prostate: Secondary | ICD-10-CM

## 2017-11-21 LAB — PSA: PROSTATE SPECIFIC AG, SERUM: 0.3 ng/mL (ref 0.0–4.0)

## 2017-11-25 ENCOUNTER — Ambulatory Visit: Payer: Medicare HMO | Admitting: Urology

## 2017-11-25 ENCOUNTER — Encounter: Payer: Self-pay | Admitting: Urology

## 2017-11-25 VITALS — BP 171/93 | HR 67 | Ht 64.0 in | Wt 210.8 lb

## 2017-11-25 DIAGNOSIS — C61 Malignant neoplasm of prostate: Secondary | ICD-10-CM

## 2017-11-25 DIAGNOSIS — J309 Allergic rhinitis, unspecified: Secondary | ICD-10-CM | POA: Insufficient documentation

## 2017-11-25 DIAGNOSIS — D649 Anemia, unspecified: Secondary | ICD-10-CM | POA: Insufficient documentation

## 2017-11-25 DIAGNOSIS — L929 Granulomatous disorder of the skin and subcutaneous tissue, unspecified: Secondary | ICD-10-CM | POA: Insufficient documentation

## 2017-11-25 DIAGNOSIS — I10 Essential (primary) hypertension: Secondary | ICD-10-CM | POA: Insufficient documentation

## 2017-11-25 DIAGNOSIS — Z86018 Personal history of other benign neoplasm: Secondary | ICD-10-CM | POA: Insufficient documentation

## 2017-11-25 DIAGNOSIS — E785 Hyperlipidemia, unspecified: Secondary | ICD-10-CM | POA: Insufficient documentation

## 2017-11-25 DIAGNOSIS — J329 Chronic sinusitis, unspecified: Secondary | ICD-10-CM | POA: Insufficient documentation

## 2017-11-25 NOTE — Progress Notes (Signed)
11/25/2017 10:51 AM   Nicholas Schneider [birth year] [id number]  Referring provider: Adin Hector, MD [place] [facility] [place], [place] [address]  Chief Complaint  Patient presents with  . Follow-up    HPI: 72 year old male presents for follow-up of prostate cancer.  He underwent a radical prostatectomy around 2007 with positive margins and a detectable PSA postop and underwent radiation therapy approximately 3 months after his surgery.  PSA nadir was <0.1 that persisted until at least 2011.  A PSA in April 2016 was 0.11 which is slowly risen to a level of 0.36 in February 2019.  CT of the abdomen and pelvis in February 2019 showed no adenopathy or evidence of gross disease in the pelvis.  He has no complaints today. PSA performed on 8/9 was stable at 0.3   PMH: Past Medical History:  Diagnosis Date  . Allergic rhinitis   . Anemia   . Cancer ([place])   . Chest pain of uncertain etiology   . Diabetes mellitus without complication ([place])   . Hyperlipidemia   . Hypertension   . Lipoma of arm   . Recurrent sinusitis     Surgical History: Past Surgical History:  Procedure Laterality Date  . COLONOSCOPY    . COLONOSCOPY WITH PROPOFOL N/A 04/22/2017   Procedure: COLONOSCOPY WITH PROPOFOL;  Surgeon: Manya Silvas, MD;  Location: [facility] ENDOSCOPY;  Service: Endoscopy;  Laterality: N/A;  . PROSTATE SURGERY      Home Medications:  Allergies as of 11/25/2017   No Known Allergies     Medication List        Accurate as of 11/25/17 10:51 AM. Always use your most recent med list.          ACCU-CHEK SMARTVIEW test strip Generic drug:  glucose blood   aspirin 81 MG EC tablet Take 81 mg by mouth daily. Swallow whole.   azelastine 0.1 % nasal spray Commonly known as:  ASTELIN Place 1 spray into both nostrils 2 (two) times daily. Use in each nostril as directed   benzonatate 200 MG capsule Commonly known as:  TESSALON TAKE 1 CAPSULE BY MOUTH EVERY DAY 3  TIMES A DAY AS NEEDED FOR COUGH   cloNIDine 0.1 MG tablet Commonly known as:  CATAPRES Take 0.1 mg by mouth 2 (two) times daily.   diphenhydrAMINE 2 % cream Commonly known as:  BENADRYL Apply topically.   glipiZIDE 10 MG tablet Commonly known as:  GLUCOTROL Take 10 mg by mouth daily.   hydrochlorothiazide 25 MG tablet Commonly known as:  HYDRODIURIL Take 25 mg by mouth daily.   hydrOXYzine 10 MG tablet Commonly known as:  ATARAX/VISTARIL TAKE 1 TABLET (10 MG TOTAL) BY MOUTH 3 (THREE) TIMES DAILY AS NEEDED FOR ITCHING   metFORMIN 1000 MG tablet Commonly known as:  GLUCOPHAGE Take 1,000 mg by mouth 2 (two) times daily with a meal.   multivitamin tablet Take 1 tablet by mouth daily.   predniSONE 10 MG (21) Tbpk tablet Commonly known as:  STERAPRED UNI-PAK 21 TAB Take by mouth daily. Dispense steroid taper pack as directed   quinapril 20 MG tablet Commonly known as:  ACCUPRIL Take 20 mg by mouth 2 (two) times daily.   ranitidine 150 MG tablet Commonly known as:  ZANTAC Take 150 mg by mouth daily as needed for heartburn.   rosuvastatin 10 MG tablet Commonly known as:  CRESTOR Take 10 mg by mouth daily.   sildenafil 20 MG tablet Commonly  known as:  REVATIO TAKE 2-5 TABLETS BY M0UTH DAILY AS NEEDED   simvastatin 20 MG tablet Commonly known as:  ZOCOR Take 20 mg by mouth daily.   triamcinolone ointment 0.1 % Commonly known as:  KENALOG APPLY TO AFFECTED AREA TWICE A DAY   UNABLE TO FIND UA dipsticks   verapamil 240 MG CR tablet Commonly known as:  CALAN-SR Take 240 mg by mouth at bedtime.       Allergies: No Known Allergies  Family History: No family history on file.  Social History:  reports that he quit smoking about 26 years ago. He has never used smokeless tobacco. He reports that he does not drink alcohol or use drugs.  ROS: UROLOGY Frequent Urination?: No Hard to postpone urination?: No Burning/pain with urination?: No Get up at night to  urinate?: No Leakage of urine?: No Urine stream starts and stops?: No Trouble starting stream?: No Do you have to strain to urinate?: No Blood in urine?: No Urinary tract infection?: No Sexually transmitted disease?: No Injury to kidneys or bladder?: No Painful intercourse?: No Weak stream?: No Erection problems?: No Penile pain?: No  Gastrointestinal Nausea?: No Vomiting?: No Indigestion/heartburn?: No Diarrhea?: No Constipation?: No  Constitutional Fever: No Night sweats?: No Weight loss?: No Fatigue?: No  Skin Skin rash/lesions?: No Itching?: No  Eyes Blurred vision?: No Double vision?: No  Ears/Nose/Throat Sore throat?: No Sinus problems?: No  Hematologic/Lymphatic Swollen glands?: No Easy bruising?: No  Cardiovascular Leg swelling?: No Chest pain?: No  Respiratory Cough?: No Shortness of breath?: No  Endocrine Excessive thirst?: No  Musculoskeletal Back pain?: No Joint pain?: No  Neurological Headaches?: No Dizziness?: No  Psychologic Depression?: No Anxiety?: No  Physical Exam: BP (!) 171/93 (BP Location: Left Arm, Patient Position: Sitting, Cuff Size: Normal)   Pulse 67   Ht 5\' 4"  (1.626 m)   Wt 210 lb 12.8 oz (95.6 kg)   BMI 36.18 kg/m   Constitutional:  Alert and oriented, No acute distress. HEENT: [place] AT, moist mucus membranes.  Trachea midline, no masses. Cardiovascular: No clubbing, cyanosis, or edema. Respiratory: Normal respiratory effort, no increased work of breathing. GI: Abdomen is soft, nontender, nondistended, no abdominal masses GU: No CVA tenderness Lymph: No cervical or inguinal lymphadenopathy. Skin: No rashes, bruises or suspicious lesions. Neurologic: Grossly intact, no focal deficits, moving all 4 extremities. Psychiatric: Normal mood and affect.   Assessment & Plan:   72 year old male with a detectable but stable PSA after radical prostatectomy and adjuvant radiation.  I have recommended continue  surveillance and a follow-up PSA/visit in 6 months.   Abbie Sons, [place] [address] [place], [place] [address] [phone]

## 2017-11-27 DIAGNOSIS — E119 Type 2 diabetes mellitus without complications: Secondary | ICD-10-CM | POA: Diagnosis not present

## 2017-12-02 DIAGNOSIS — E1169 Type 2 diabetes mellitus with other specified complication: Secondary | ICD-10-CM | POA: Diagnosis not present

## 2017-12-02 DIAGNOSIS — C61 Malignant neoplasm of prostate: Secondary | ICD-10-CM | POA: Diagnosis not present

## 2017-12-02 DIAGNOSIS — T783XXS Angioneurotic edema, sequela: Secondary | ICD-10-CM | POA: Diagnosis not present

## 2017-12-02 DIAGNOSIS — E7849 Other hyperlipidemia: Secondary | ICD-10-CM | POA: Diagnosis not present

## 2017-12-02 DIAGNOSIS — D649 Anemia, unspecified: Secondary | ICD-10-CM | POA: Diagnosis not present

## 2017-12-02 DIAGNOSIS — E119 Type 2 diabetes mellitus without complications: Secondary | ICD-10-CM | POA: Diagnosis not present

## 2017-12-02 DIAGNOSIS — I10 Essential (primary) hypertension: Secondary | ICD-10-CM | POA: Diagnosis not present

## 2017-12-02 DIAGNOSIS — B351 Tinea unguium: Secondary | ICD-10-CM | POA: Diagnosis not present

## 2017-12-09 ENCOUNTER — Encounter: Payer: Self-pay | Admitting: Urology

## 2017-12-17 DIAGNOSIS — J3089 Other allergic rhinitis: Secondary | ICD-10-CM | POA: Diagnosis not present

## 2017-12-17 DIAGNOSIS — T783XXA Angioneurotic edema, initial encounter: Secondary | ICD-10-CM | POA: Diagnosis not present

## 2017-12-17 DIAGNOSIS — J309 Allergic rhinitis, unspecified: Secondary | ICD-10-CM | POA: Diagnosis not present

## 2017-12-18 DIAGNOSIS — H4020X0 Unspecified primary angle-closure glaucoma, stage unspecified: Secondary | ICD-10-CM | POA: Diagnosis not present

## 2017-12-18 DIAGNOSIS — H4020X2 Unspecified primary angle-closure glaucoma, moderate stage: Secondary | ICD-10-CM | POA: Diagnosis not present

## 2017-12-30 ENCOUNTER — Other Ambulatory Visit: Payer: Self-pay

## 2017-12-31 DIAGNOSIS — H4020X0 Unspecified primary angle-closure glaucoma, stage unspecified: Secondary | ICD-10-CM | POA: Diagnosis not present

## 2018-02-05 DIAGNOSIS — H40033 Anatomical narrow angle, bilateral: Secondary | ICD-10-CM | POA: Diagnosis not present

## 2018-02-26 DIAGNOSIS — E119 Type 2 diabetes mellitus without complications: Secondary | ICD-10-CM | POA: Diagnosis not present

## 2018-03-05 DIAGNOSIS — I10 Essential (primary) hypertension: Secondary | ICD-10-CM | POA: Diagnosis not present

## 2018-03-05 DIAGNOSIS — B351 Tinea unguium: Secondary | ICD-10-CM | POA: Diagnosis not present

## 2018-03-05 DIAGNOSIS — E7849 Other hyperlipidemia: Secondary | ICD-10-CM | POA: Diagnosis not present

## 2018-03-05 DIAGNOSIS — C61 Malignant neoplasm of prostate: Secondary | ICD-10-CM | POA: Diagnosis not present

## 2018-03-05 DIAGNOSIS — E1169 Type 2 diabetes mellitus with other specified complication: Secondary | ICD-10-CM | POA: Diagnosis not present

## 2018-03-05 DIAGNOSIS — E119 Type 2 diabetes mellitus without complications: Secondary | ICD-10-CM | POA: Diagnosis not present

## 2018-03-19 DIAGNOSIS — H402231 Chronic angle-closure glaucoma, bilateral, mild stage: Secondary | ICD-10-CM | POA: Diagnosis not present

## 2018-05-20 DIAGNOSIS — H402231 Chronic angle-closure glaucoma, bilateral, mild stage: Secondary | ICD-10-CM | POA: Diagnosis not present

## 2018-05-21 ENCOUNTER — Other Ambulatory Visit: Payer: Self-pay | Admitting: Family Medicine

## 2018-05-21 ENCOUNTER — Other Ambulatory Visit: Payer: Medicare HMO

## 2018-05-21 DIAGNOSIS — C61 Malignant neoplasm of prostate: Secondary | ICD-10-CM

## 2018-05-22 LAB — PSA: Prostate Specific Ag, Serum: 0.4 ng/mL (ref 0.0–4.0)

## 2018-05-25 DIAGNOSIS — H402231 Chronic angle-closure glaucoma, bilateral, mild stage: Secondary | ICD-10-CM | POA: Diagnosis not present

## 2018-05-27 ENCOUNTER — Other Ambulatory Visit: Payer: Medicare HMO

## 2018-05-28 ENCOUNTER — Ambulatory Visit (INDEPENDENT_AMBULATORY_CARE_PROVIDER_SITE_OTHER): Payer: Medicare HMO | Admitting: Urology

## 2018-05-28 ENCOUNTER — Encounter: Payer: Self-pay | Admitting: Urology

## 2018-05-28 VITALS — BP 172/99 | HR 76 | Ht 64.0 in | Wt 200.9 lb

## 2018-05-28 DIAGNOSIS — C61 Malignant neoplasm of prostate: Secondary | ICD-10-CM | POA: Diagnosis not present

## 2018-05-28 NOTE — Progress Notes (Signed)
05/28/2018 10:14 AM   Nicholas Schneider 06-Apr-1946 [id number]  Referring provider: Adin Hector, MD [place] [facility] [place], [place] [address]  Chief Complaint  Patient presents with  . Follow-up   Urologic history: 1.  Adenocarcinoma prostate  - Radical retropubic prostatectomy 02/2006  - pT2c N0 M0 adenocarcinoma prostate, Gleason 3+4  - No extracapsular extension however positive margin at apex  - Detectable PSA postop treated adjuvanted IMRT  - PSA undetectable until 2011  - CT abdomen pelvis 05/2017; no adenopathy or evidence of metastatic disease   HPI: 73 year old male presents for prostate cancer follow-up.  He has no complaints today.  Denies dysuria, gross hematuria or flank/abdominal/pelvic/scrotal pain. PSA trend as follows:  12/2009  <0.1 07/2014  0.11 01/2015 0.11 07/2015  0.18 10/2015  0.17 01/2016 0.23 04/2016  0.20 07/2016  0.23 11/2016  0.24 05/2017  0.36 11/2017  0.3 05/2018  0.4   PMH: Past Medical History:  Diagnosis Date  . Allergic rhinitis   . Anemia   . Cancer ([place])   . Chest pain of uncertain etiology   . Diabetes mellitus without complication ([place])   . Hyperlipidemia   . Hypertension   . Lipoma of arm   . Recurrent sinusitis     Surgical History: Past Surgical History:  Procedure Laterality Date  . COLONOSCOPY    . COLONOSCOPY WITH PROPOFOL N/A 04/22/2017   Procedure: COLONOSCOPY WITH PROPOFOL;  Surgeon: Manya Silvas, MD;  Location: [facility] ENDOSCOPY;  Service: Endoscopy;  Laterality: N/A;  . PROSTATE SURGERY      Home Medications:  Allergies as of 05/28/2018   No Known Allergies     Medication List       Accurate as of May 28, 2018 11:59 PM. Always use your most recent med list.        ACCU-CHEK SMARTVIEW test strip Generic drug:  glucose blood USE THREE TIMES DAILY AS INSTRUCTED   aspirin 81 MG EC tablet Take 81 mg by mouth daily. Swallow whole.   azelastine 0.1 % nasal spray Commonly  known as:  ASTELIN Place 1 spray into both nostrils 2 (two) times daily. Use in each nostril as directed   cloNIDine 0.1 MG tablet Commonly known as:  CATAPRES Take 0.1 mg by mouth 2 (two) times daily.   dorzolamide-timolol 22.3-6.8 MG/ML ophthalmic solution Commonly known as:  COSOPT   empagliflozin 10 MG Tabs tablet Commonly known as:  JARDIANCE Take by mouth.   glipiZIDE 10 MG 24 hr tablet Commonly known as:  GLUCOTROL XL   hydrochlorothiazide 25 MG tablet Commonly known as:  HYDRODIURIL Take 25 mg by mouth daily.   latanoprost 0.005 % ophthalmic solution Commonly known as:  XALATAN   metFORMIN 1000 MG tablet Commonly known as:  GLUCOPHAGE Take 1,000 mg by mouth 2 (two) times daily with a meal.   multivitamin tablet Take 1 tablet by mouth daily.   quinapril 20 MG tablet Commonly known as:  ACCUPRIL Take 20 mg by mouth 2 (two) times daily.   sildenafil 20 MG tablet Commonly known as:  REVATIO TAKE 2-5 TABLETS BY M0UTH DAILY AS NEEDED   simvastatin 20 MG tablet Commonly known as:  ZOCOR Take 20 mg by mouth daily.   timolol 0.5 % ophthalmic solution Commonly known as:  TIMOPTIC       Allergies: No Known Allergies  Family History: No family history on file.  Social History:  reports that he quit smoking about 27  years ago. He has never used smokeless tobacco. He reports that he does not drink alcohol or use drugs.  ROS: Reviewed: No significant change from 11/25/2017  Physical Exam: BP (!) 172/99 (BP Location: Left Arm, Patient Position: Sitting, Cuff Size: Normal)   Pulse 76   Ht 5\' 4"  (1.626 m)   Wt 200 lb 14.4 oz (91.1 kg)   BMI 34.48 kg/m   Constitutional:  Alert and oriented, No acute distress. HEENT: [place] AT, moist mucus membranes.  Trachea midline, no masses. Cardiovascular: No clubbing, cyanosis, or edema. Respiratory: Normal respiratory effort, no increased work of breathing. Skin: No rashes, bruises or suspicious lesions. Neurologic: Grossly  intact, no focal deficits, moving all 4 extremities. Psychiatric: Normal mood and affect.   Assessment & Plan:   73 year old male with adenocarcinoma the prostate status post radical prostatectomy and adjunct of radiation therapy.  He has a detectable and slowly rising PSA.  He is asymptomatic.  We discussed his detectable PSA is consistent with biochemical recurrence.  Options of observation versus hormonal therapy were discussed.  He has elected observation.  Follow-up PSA only in 6 months and office visit/PSA 1 year.   Abbie Sons, [place] [address] [place], [place] [address] [phone]

## 2018-05-30 ENCOUNTER — Encounter: Payer: Self-pay | Admitting: Urology

## 2018-05-31 ENCOUNTER — Ambulatory Visit: Payer: Medicare HMO | Admitting: Urology

## 2018-08-27 DIAGNOSIS — C61 Malignant neoplasm of prostate: Secondary | ICD-10-CM | POA: Diagnosis not present

## 2018-08-27 DIAGNOSIS — E7849 Other hyperlipidemia: Secondary | ICD-10-CM | POA: Diagnosis not present

## 2018-08-27 DIAGNOSIS — E119 Type 2 diabetes mellitus without complications: Secondary | ICD-10-CM | POA: Diagnosis not present

## 2018-08-27 DIAGNOSIS — I10 Essential (primary) hypertension: Secondary | ICD-10-CM | POA: Diagnosis not present

## 2018-09-09 DIAGNOSIS — E7849 Other hyperlipidemia: Secondary | ICD-10-CM | POA: Diagnosis not present

## 2018-09-09 DIAGNOSIS — E119 Type 2 diabetes mellitus without complications: Secondary | ICD-10-CM | POA: Diagnosis not present

## 2018-09-09 DIAGNOSIS — D649 Anemia, unspecified: Secondary | ICD-10-CM | POA: Diagnosis not present

## 2018-09-09 DIAGNOSIS — Z0000 Encounter for general adult medical examination without abnormal findings: Secondary | ICD-10-CM | POA: Diagnosis not present

## 2018-09-09 DIAGNOSIS — E1169 Type 2 diabetes mellitus with other specified complication: Secondary | ICD-10-CM | POA: Diagnosis not present

## 2018-09-09 DIAGNOSIS — J329 Chronic sinusitis, unspecified: Secondary | ICD-10-CM | POA: Diagnosis not present

## 2018-09-09 DIAGNOSIS — I10 Essential (primary) hypertension: Secondary | ICD-10-CM | POA: Diagnosis not present

## 2018-09-09 DIAGNOSIS — C61 Malignant neoplasm of prostate: Secondary | ICD-10-CM | POA: Diagnosis not present

## 2018-11-26 ENCOUNTER — Other Ambulatory Visit: Payer: Medicare HMO

## 2018-11-26 ENCOUNTER — Other Ambulatory Visit: Payer: Self-pay

## 2018-11-26 DIAGNOSIS — C61 Malignant neoplasm of prostate: Secondary | ICD-10-CM

## 2018-11-27 LAB — PSA: Prostate Specific Ag, Serum: 0.2 ng/mL (ref 0.0–4.0)

## 2018-11-29 DIAGNOSIS — E7849 Other hyperlipidemia: Secondary | ICD-10-CM | POA: Diagnosis not present

## 2018-11-29 DIAGNOSIS — D649 Anemia, unspecified: Secondary | ICD-10-CM | POA: Diagnosis not present

## 2018-11-29 DIAGNOSIS — E119 Type 2 diabetes mellitus without complications: Secondary | ICD-10-CM | POA: Diagnosis not present

## 2018-11-30 ENCOUNTER — Telehealth: Payer: Self-pay

## 2018-11-30 NOTE — Telephone Encounter (Signed)
-----   Message from Abbie Sons, MD sent at 11/29/2018  4:29 PM EDT ----- PSA was 0.2.  Follow-up as scheduled

## 2018-11-30 NOTE — Telephone Encounter (Signed)
mychart sent.

## 2018-12-06 DIAGNOSIS — E7849 Other hyperlipidemia: Secondary | ICD-10-CM | POA: Diagnosis not present

## 2018-12-06 DIAGNOSIS — C61 Malignant neoplasm of prostate: Secondary | ICD-10-CM | POA: Diagnosis not present

## 2018-12-06 DIAGNOSIS — E119 Type 2 diabetes mellitus without complications: Secondary | ICD-10-CM | POA: Diagnosis not present

## 2018-12-06 DIAGNOSIS — E1169 Type 2 diabetes mellitus with other specified complication: Secondary | ICD-10-CM | POA: Diagnosis not present

## 2018-12-06 DIAGNOSIS — I10 Essential (primary) hypertension: Secondary | ICD-10-CM | POA: Diagnosis not present

## 2018-12-06 DIAGNOSIS — B351 Tinea unguium: Secondary | ICD-10-CM | POA: Diagnosis not present

## 2018-12-06 DIAGNOSIS — D649 Anemia, unspecified: Secondary | ICD-10-CM | POA: Diagnosis not present

## 2018-12-07 DIAGNOSIS — E119 Type 2 diabetes mellitus without complications: Secondary | ICD-10-CM | POA: Diagnosis not present

## 2019-05-04 DIAGNOSIS — E119 Type 2 diabetes mellitus without complications: Secondary | ICD-10-CM | POA: Diagnosis not present

## 2019-05-04 DIAGNOSIS — D649 Anemia, unspecified: Secondary | ICD-10-CM | POA: Diagnosis not present

## 2019-05-04 DIAGNOSIS — E7849 Other hyperlipidemia: Secondary | ICD-10-CM | POA: Diagnosis not present

## 2019-05-11 DIAGNOSIS — B351 Tinea unguium: Secondary | ICD-10-CM | POA: Diagnosis not present

## 2019-05-11 DIAGNOSIS — Z6835 Body mass index (BMI) 35.0-35.9, adult: Secondary | ICD-10-CM | POA: Diagnosis not present

## 2019-05-11 DIAGNOSIS — I10 Essential (primary) hypertension: Secondary | ICD-10-CM | POA: Diagnosis not present

## 2019-05-11 DIAGNOSIS — J309 Allergic rhinitis, unspecified: Secondary | ICD-10-CM | POA: Diagnosis not present

## 2019-05-11 DIAGNOSIS — C61 Malignant neoplasm of prostate: Secondary | ICD-10-CM | POA: Diagnosis not present

## 2019-05-11 DIAGNOSIS — Z87891 Personal history of nicotine dependence: Secondary | ICD-10-CM | POA: Diagnosis not present

## 2019-05-11 DIAGNOSIS — E1169 Type 2 diabetes mellitus with other specified complication: Secondary | ICD-10-CM | POA: Diagnosis not present

## 2019-05-30 ENCOUNTER — Ambulatory Visit: Payer: Medicare HMO | Admitting: Urology

## 2019-05-31 ENCOUNTER — Other Ambulatory Visit: Payer: Self-pay

## 2019-05-31 ENCOUNTER — Other Ambulatory Visit: Payer: Medicare HMO

## 2019-05-31 DIAGNOSIS — C61 Malignant neoplasm of prostate: Secondary | ICD-10-CM

## 2019-06-01 ENCOUNTER — Encounter: Payer: Self-pay | Admitting: Urology

## 2019-06-01 ENCOUNTER — Ambulatory Visit: Payer: Medicare HMO | Admitting: Urology

## 2019-06-01 VITALS — BP 181/105 | HR 62 | Ht 63.0 in | Wt 207.0 lb

## 2019-06-01 DIAGNOSIS — C61 Malignant neoplasm of prostate: Secondary | ICD-10-CM

## 2019-06-01 DIAGNOSIS — N5231 Erectile dysfunction following radical prostatectomy: Secondary | ICD-10-CM

## 2019-06-01 LAB — PSA: Prostate Specific Ag, Serum: 0.4 ng/mL (ref 0.0–4.0)

## 2019-06-01 MED ORDER — TADALAFIL 20 MG PO TABS: ORAL_TABLET | ORAL | 6 refills | Status: DC

## 2019-06-01 NOTE — Progress Notes (Signed)
06/01/2019 9:12 AM   Nicholas Schneider 1946/02/08 [id number]  Referring provider: Adin Hector, MD [place] [facility] [place],  [place] [address]  Chief Complaint  Patient presents with  . Prostate Cancer    Urologic history: 1.  Adenocarcinoma prostate             - Radical retropubic prostatectomy 02/2006             - pT2c N0 M0 adenocarcinoma prostate, Gleason 3+4             - No extracapsular extension however positive margin at apex             - Detectable PSA postop treated adjuvant IMRT             - PSA undetectable until 2011             - CT abdomen pelvis 05/2017; no adenopathy or evidence of metastatic disease   HPI: 74 y.o. male presents for annual follow-up.  He has no bothersome lower urinary tract symptoms.  Denies dysuria or gross hematuria.  No flank, abdominal or pelvic pain.  His most recent PSA performed yesterday was stable at 0.4  PSA trend: 12/2009             <0.1 07/2014             0.11 01/2015           0.11 07/2015             0.18 10/2015             0.17 01/2016           0.23 04/2016             0.20 07/2016             0.23 11/2016             0.24 05/2017             0.36 11/2017             0.3 05/2018             0.4 11/2018  0.2 05/2019             0.4  He is taking sildenafil 20 mg as needed for ED.  He inquired about the availability of generic tadalafil which he had taken previously and he felt was more effective.  PMH: Past Medical History:  Diagnosis Date  . Allergic rhinitis   . Anemia   . Cancer ([place])   . Chest pain of uncertain etiology   . Diabetes mellitus without complication ([place])   . Hyperlipidemia   . Hypertension   . Lipoma of arm   . Recurrent sinusitis     Surgical History: Past Surgical History:  Procedure Laterality Date  . COLONOSCOPY    . COLONOSCOPY WITH PROPOFOL N/A 04/22/2017   Procedure: COLONOSCOPY WITH PROPOFOL;  Surgeon: Manya Silvas, MD;  Location: [facility] ENDOSCOPY;   Service: Endoscopy;  Laterality: N/A;  . PROSTATE SURGERY      Home Medications:  Allergies as of 06/01/2019   No Known Allergies     Medication List       Accurate as of June 01, 2019  9:12 AM. If you have any questions, ask your nurse or doctor.        Accu-Chek SmartView test strip Generic drug: glucose blood USE THREE TIMES  DAILY AS INSTRUCTED   aspirin 81 MG EC tablet Take 81 mg by mouth daily. Swallow whole.   azelastine 0.1 % nasal spray Commonly known as: ASTELIN Place 1 spray into both nostrils 2 (two) times daily. Use in each nostril as directed   cloNIDine 0.1 MG tablet Commonly known as: CATAPRES Take 0.1 mg by mouth 2 (two) times daily.   dorzolamide-timolol 22.3-6.8 MG/ML ophthalmic solution Commonly known as: COSOPT   empagliflozin 10 MG Tabs tablet Commonly known as: JARDIANCE Take by mouth.   glipiZIDE 10 MG 24 hr tablet Commonly known as: GLUCOTROL XL   hydrochlorothiazide 25 MG tablet Commonly known as: HYDRODIURIL Take 25 mg by mouth daily.   latanoprost 0.005 % ophthalmic solution Commonly known as: XALATAN   metFORMIN 1000 MG tablet Commonly known as: GLUCOPHAGE Take 1,000 mg by mouth 2 (two) times daily with a meal.   multivitamin tablet Take 1 tablet by mouth daily.   quinapril 20 MG tablet Commonly known as: ACCUPRIL Take 20 mg by mouth 2 (two) times daily.   sildenafil 20 MG tablet Commonly known as: REVATIO TAKE 2-5 TABLETS BY M0UTH DAILY AS NEEDED   simvastatin 20 MG tablet Commonly known as: ZOCOR Take 20 mg by mouth daily.   tadalafil 5 MG tablet Commonly known as: CIALIS Take by mouth.   timolol 0.5 % ophthalmic solution Commonly known as: TIMOPTIC       Allergies: No Known Allergies  Family History: No family history on file.  Social History:  reports that he quit smoking about 28 years ago. He has never used smokeless tobacco. He reports that he does not drink alcohol or use drugs.   Physical  Exam: BP (!) 181/105 (BP Location: Left Arm, Patient Position: Sitting, Cuff Size: Large)   Pulse 62   Ht 5\' 3"  (1.6 m)   Wt 207 lb (93.9 kg)   BMI 36.67 kg/m   Constitutional:  Alert and oriented, No acute distress. HEENT: [place] AT, moist mucus membranes.  Trachea midline, no masses. Cardiovascular: No clubbing, cyanosis, or edema. Respiratory: Normal respiratory effort, no increased work of breathing. Skin: No rashes, bruises or suspicious lesions. Neurologic: Grossly intact, no focal deficits, moving all 4 extremities. Psychiatric: Normal mood and affect.   Assessment & Plan:    - Prostate cancer Detectable but stable PSA.  Imaging 2019 showed no evidence of disease.  We will continue to follow annually  - Erectile dysfunction Rx tadalafil 20 mg sent to pharmacy.   Abbie Sons, [place] [address], [place], [place] [address] [phone]

## 2019-06-03 ENCOUNTER — Encounter: Payer: Self-pay | Admitting: Urology

## 2019-06-03 DIAGNOSIS — N5231 Erectile dysfunction following radical prostatectomy: Secondary | ICD-10-CM | POA: Insufficient documentation

## 2019-06-22 DIAGNOSIS — Z23 Encounter for immunization: Secondary | ICD-10-CM | POA: Diagnosis not present

## 2019-08-02 DIAGNOSIS — H402231 Chronic angle-closure glaucoma, bilateral, mild stage: Secondary | ICD-10-CM | POA: Diagnosis not present

## 2019-09-09 DIAGNOSIS — E7849 Other hyperlipidemia: Secondary | ICD-10-CM | POA: Diagnosis not present

## 2019-09-09 DIAGNOSIS — E1169 Type 2 diabetes mellitus with other specified complication: Secondary | ICD-10-CM | POA: Diagnosis not present

## 2019-09-09 DIAGNOSIS — B351 Tinea unguium: Secondary | ICD-10-CM | POA: Diagnosis not present

## 2019-09-09 DIAGNOSIS — D649 Anemia, unspecified: Secondary | ICD-10-CM | POA: Diagnosis not present

## 2019-09-16 DIAGNOSIS — Z0000 Encounter for general adult medical examination without abnormal findings: Secondary | ICD-10-CM | POA: Diagnosis not present

## 2019-09-16 DIAGNOSIS — I10 Essential (primary) hypertension: Secondary | ICD-10-CM | POA: Diagnosis not present

## 2019-09-16 DIAGNOSIS — C61 Malignant neoplasm of prostate: Secondary | ICD-10-CM | POA: Diagnosis not present

## 2019-09-16 DIAGNOSIS — B351 Tinea unguium: Secondary | ICD-10-CM | POA: Diagnosis not present

## 2019-09-16 DIAGNOSIS — E1169 Type 2 diabetes mellitus with other specified complication: Secondary | ICD-10-CM | POA: Diagnosis not present

## 2019-09-16 DIAGNOSIS — E7849 Other hyperlipidemia: Secondary | ICD-10-CM | POA: Diagnosis not present

## 2019-09-16 DIAGNOSIS — E119 Type 2 diabetes mellitus without complications: Secondary | ICD-10-CM | POA: Diagnosis not present

## 2019-09-16 DIAGNOSIS — D649 Anemia, unspecified: Secondary | ICD-10-CM | POA: Diagnosis not present

## 2019-12-14 DIAGNOSIS — E7849 Other hyperlipidemia: Secondary | ICD-10-CM | POA: Diagnosis not present

## 2019-12-14 DIAGNOSIS — E119 Type 2 diabetes mellitus without complications: Secondary | ICD-10-CM | POA: Diagnosis not present

## 2019-12-21 DIAGNOSIS — Z8546 Personal history of malignant neoplasm of prostate: Secondary | ICD-10-CM | POA: Diagnosis not present

## 2019-12-21 DIAGNOSIS — Z23 Encounter for immunization: Secondary | ICD-10-CM | POA: Diagnosis not present

## 2019-12-21 DIAGNOSIS — I10 Essential (primary) hypertension: Secondary | ICD-10-CM | POA: Diagnosis not present

## 2019-12-21 DIAGNOSIS — D649 Anemia, unspecified: Secondary | ICD-10-CM | POA: Diagnosis not present

## 2019-12-21 DIAGNOSIS — E119 Type 2 diabetes mellitus without complications: Secondary | ICD-10-CM | POA: Diagnosis not present

## 2020-01-31 DIAGNOSIS — H401131 Primary open-angle glaucoma, bilateral, mild stage: Secondary | ICD-10-CM | POA: Diagnosis not present

## 2020-03-16 DIAGNOSIS — E7849 Other hyperlipidemia: Secondary | ICD-10-CM | POA: Diagnosis not present

## 2020-03-16 DIAGNOSIS — E119 Type 2 diabetes mellitus without complications: Secondary | ICD-10-CM | POA: Diagnosis not present

## 2020-03-23 DIAGNOSIS — Z8546 Personal history of malignant neoplasm of prostate: Secondary | ICD-10-CM | POA: Diagnosis not present

## 2020-03-23 DIAGNOSIS — E1169 Type 2 diabetes mellitus with other specified complication: Secondary | ICD-10-CM | POA: Diagnosis not present

## 2020-03-23 DIAGNOSIS — Z6834 Body mass index (BMI) 34.0-34.9, adult: Secondary | ICD-10-CM | POA: Diagnosis not present

## 2020-03-23 DIAGNOSIS — I10 Essential (primary) hypertension: Secondary | ICD-10-CM | POA: Diagnosis not present

## 2020-03-23 DIAGNOSIS — D649 Anemia, unspecified: Secondary | ICD-10-CM | POA: Diagnosis not present

## 2020-03-23 DIAGNOSIS — B351 Tinea unguium: Secondary | ICD-10-CM | POA: Diagnosis not present

## 2020-04-23 DIAGNOSIS — Z20822 Contact with and (suspected) exposure to covid-19: Secondary | ICD-10-CM | POA: Diagnosis not present

## 2020-05-07 DIAGNOSIS — M25572 Pain in left ankle and joints of left foot: Secondary | ICD-10-CM | POA: Diagnosis not present

## 2020-05-07 DIAGNOSIS — M2142 Flat foot [pes planus] (acquired), left foot: Secondary | ICD-10-CM | POA: Diagnosis not present

## 2020-05-07 DIAGNOSIS — M76822 Posterior tibial tendinitis, left leg: Secondary | ICD-10-CM | POA: Diagnosis not present

## 2020-05-07 DIAGNOSIS — M2141 Flat foot [pes planus] (acquired), right foot: Secondary | ICD-10-CM | POA: Diagnosis not present

## 2020-05-14 ENCOUNTER — Telehealth: Payer: Self-pay

## 2020-05-14 NOTE — Telephone Encounter (Signed)
Called pt no answer, LM asking pt to call back as we need to r/s his appt on 2/17 to the afternoon.

## 2020-05-28 ENCOUNTER — Other Ambulatory Visit: Payer: Self-pay

## 2020-05-28 DIAGNOSIS — C61 Malignant neoplasm of prostate: Secondary | ICD-10-CM

## 2020-05-29 ENCOUNTER — Other Ambulatory Visit: Payer: Self-pay

## 2020-05-29 ENCOUNTER — Other Ambulatory Visit: Payer: Medicare HMO

## 2020-05-29 DIAGNOSIS — C61 Malignant neoplasm of prostate: Secondary | ICD-10-CM | POA: Diagnosis not present

## 2020-05-30 LAB — PSA: Prostate Specific Ag, Serum: 0.8 ng/mL (ref 0.0–4.0)

## 2020-05-31 ENCOUNTER — Ambulatory Visit: Payer: Self-pay | Admitting: Urology

## 2020-06-01 ENCOUNTER — Encounter: Payer: Self-pay | Admitting: Urology

## 2020-06-01 ENCOUNTER — Ambulatory Visit: Payer: Medicare HMO | Admitting: Urology

## 2020-06-01 ENCOUNTER — Other Ambulatory Visit: Payer: Self-pay

## 2020-06-01 VITALS — BP 181/113 | HR 87 | Ht 64.0 in | Wt 232.0 lb

## 2020-06-01 DIAGNOSIS — N5231 Erectile dysfunction following radical prostatectomy: Secondary | ICD-10-CM

## 2020-06-01 DIAGNOSIS — C61 Malignant neoplasm of prostate: Secondary | ICD-10-CM | POA: Diagnosis not present

## 2020-06-01 MED ORDER — TADALAFIL 20 MG PO TABS: ORAL_TABLET | ORAL | 6 refills | Status: DC

## 2020-06-01 NOTE — Progress Notes (Signed)
06/01/2020 10:23 AM   Nicholas Schneider 05-06-1945 [id number]  Referring provider: Adin Hector, MD [place] [facility] [place],  [place] [address]  Chief Complaint  Patient presents with  . Prostate Cancer      Urologic history: 1.Adenocarcinoma prostate -Radical retropubic prostatectomy 02/2006 - pT2c N0 M0adenocarcinoma prostate, Gleason 3+4 - No extracapsular extension however positive margin at apex -Detectable PSA postop treated adjuvant IMRT -PSA undetectable until 2011 -CT abdomen pelvis 05/2017;no adenopathy or evidence of metastatic disease  HPI: 75 y.o. male presents for annual follow-up.   No bothersome LUTS  Denies dysuria or gross hematuria  Denies flank, abdominal or pelvic pain  PSA 05/2020 has doubled to 0.8   Tadalafil has been effective for ED     PMH: Past Medical History:  Diagnosis Date  . Allergic rhinitis   . Anemia   . Cancer ([place])   . Chest pain of uncertain etiology   . Diabetes mellitus without complication ([place])   . Hyperlipidemia   . Hypertension   . Lipoma of arm   . Recurrent sinusitis     Surgical History: Past Surgical History:  Procedure Laterality Date  . COLONOSCOPY    . COLONOSCOPY WITH PROPOFOL N/A 04/22/2017   Procedure: COLONOSCOPY WITH PROPOFOL;  Surgeon: Manya Silvas, MD;  Location: [facility] ENDOSCOPY;  Service: Endoscopy;  Laterality: N/A;  . PROSTATE SURGERY      Home Medications:  Allergies as of 06/01/2020   No Known Allergies     Medication List       Accurate as of June 01, 2020 10:23 AM. If you have any questions, ask your nurse or doctor.        aspirin 81 MG EC tablet Take 81 mg by mouth daily. Swallow whole.   azelastine 0.1 % nasal spray Commonly known as: ASTELIN Place 1 spray into both nostrils 2 (two) times daily. Use in each nostril as directed   cloNIDine 0.1 MG  tablet Commonly known as: CATAPRES Take 0.1 mg by mouth 2 (two) times daily.   dorzolamide-timolol 22.3-6.8 MG/ML ophthalmic solution Commonly known as: COSOPT   empagliflozin 10 MG Tabs tablet Commonly known as: JARDIANCE Take by mouth.   glipiZIDE 10 MG 24 hr tablet Commonly known as: GLUCOTROL XL   glucose blood test strip USE THREE TIMES DAILY AS INSTRUCTED   hydrochlorothiazide 25 MG tablet Commonly known as: HYDRODIURIL Take 25 mg by mouth daily.   latanoprost 0.005 % ophthalmic solution Commonly known as: XALATAN   metFORMIN 1000 MG tablet Commonly known as: GLUCOPHAGE Take 1,000 mg by mouth 2 (two) times daily with a meal.   multivitamin tablet Take 1 tablet by mouth daily.   quinapril 20 MG tablet Commonly known as: ACCUPRIL Take 20 mg by mouth 2 (two) times daily.   simvastatin 20 MG tablet Commonly known as: ZOCOR Take 20 mg by mouth daily.   tadalafil 20 MG tablet Commonly known as: CIALIS 1 tab 1 hour prior to intercourse   timolol 0.5 % ophthalmic solution Commonly known as: TIMOPTIC       Allergies: No Known Allergies  Family History: No family history on file.  Social History:  reports that he quit smoking about 29 years ago. He has never used smokeless tobacco. He reports that he does not drink alcohol and does not use drugs.   Physical Exam: BP (!) 181/113   Pulse 87   Ht 5\' 4"  (1.626 m)   Wt 232  lb (105.2 kg)   BMI 39.82 kg/m   Constitutional:  Alert and oriented, No acute distress. HEENT: [place] AT, moist mucus membranes.  Trachea midline, no masses. Cardiovascular: No clubbing, cyanosis, or edema. Respiratory: Normal respiratory effort, no increased work of breathing.   Assessment & Plan:    1.  Prostate cancer  Status post radical prostatectomy/adjuvant IMRT for + margin  Biochemical recurrence  PSA has doubled no disease volume unlikely significant enough to pick up on routine scanning (CT/bone scan)  Options were  discussed of ADT versus continued surveillance  Consider PSMA scan  2.  Erectile dysfunction  Improved on tadalafil  Refill sent to [place], [place] [address], [place], [place] [address] [phone]

## 2020-06-30 ENCOUNTER — Other Ambulatory Visit: Payer: Self-pay | Admitting: Urology

## 2020-06-30 DIAGNOSIS — C61 Malignant neoplasm of prostate: Secondary | ICD-10-CM

## 2020-07-02 ENCOUNTER — Encounter: Payer: Self-pay | Admitting: *Deleted

## 2020-07-18 ENCOUNTER — Other Ambulatory Visit: Payer: Self-pay

## 2020-07-18 ENCOUNTER — Encounter
Admission: RE | Admit: 2020-07-18 | Discharge: 2020-07-18 | Disposition: A | Discharge status: Home / Self Care | Payer: Medicare HMO | Source: Ambulatory Visit | Attending: Urology | Admitting: Urology

## 2020-07-18 DIAGNOSIS — C61 Malignant neoplasm of prostate: Secondary | ICD-10-CM | POA: Diagnosis not present

## 2020-07-18 MED ORDER — PIFLIFOLASTAT F 18 (PYLARIFY) INJECTION
9.0000 | Freq: Once | INTRAVENOUS | Status: AC
  Administered 2020-07-18: 9.28 via INTRAVENOUS

## 2020-07-23 ENCOUNTER — Telehealth: Payer: Self-pay | Admitting: *Deleted

## 2020-07-23 NOTE — Telephone Encounter (Signed)
Patient informed, voiced understanding.  °

## 2020-07-23 NOTE — Telephone Encounter (Signed)
-----   Message from Abbie Sons, MD sent at 07/22/2020 11:59 AM EDT ----- PMSA scan showed no evidence of prostate cancer recurrence.  Recommend lab visit/PSA August 2022.  Please let me know if he has any questions.

## 2020-08-21 DIAGNOSIS — E119 Type 2 diabetes mellitus without complications: Secondary | ICD-10-CM | POA: Diagnosis not present

## 2020-08-21 DIAGNOSIS — Z0100 Encounter for examination of eyes and vision without abnormal findings: Secondary | ICD-10-CM | POA: Diagnosis not present

## 2020-09-14 DIAGNOSIS — E7849 Other hyperlipidemia: Secondary | ICD-10-CM | POA: Diagnosis not present

## 2020-09-14 DIAGNOSIS — D649 Anemia, unspecified: Secondary | ICD-10-CM | POA: Diagnosis not present

## 2020-09-14 DIAGNOSIS — E119 Type 2 diabetes mellitus without complications: Secondary | ICD-10-CM | POA: Diagnosis not present

## 2020-09-17 ENCOUNTER — Other Ambulatory Visit: Payer: Self-pay | Admitting: *Deleted

## 2020-09-17 DIAGNOSIS — C61 Malignant neoplasm of prostate: Secondary | ICD-10-CM

## 2020-09-21 DIAGNOSIS — C61 Malignant neoplasm of prostate: Secondary | ICD-10-CM | POA: Diagnosis not present

## 2020-09-21 DIAGNOSIS — E119 Type 2 diabetes mellitus without complications: Secondary | ICD-10-CM | POA: Diagnosis not present

## 2020-09-21 DIAGNOSIS — I10 Essential (primary) hypertension: Secondary | ICD-10-CM | POA: Diagnosis not present

## 2020-09-21 DIAGNOSIS — Z0000 Encounter for general adult medical examination without abnormal findings: Secondary | ICD-10-CM | POA: Diagnosis not present

## 2020-09-21 DIAGNOSIS — B351 Tinea unguium: Secondary | ICD-10-CM | POA: Diagnosis not present

## 2020-09-21 DIAGNOSIS — D649 Anemia, unspecified: Secondary | ICD-10-CM | POA: Diagnosis not present

## 2020-10-08 DIAGNOSIS — I10 Essential (primary) hypertension: Secondary | ICD-10-CM | POA: Diagnosis not present

## 2020-10-19 DIAGNOSIS — I10 Essential (primary) hypertension: Secondary | ICD-10-CM | POA: Diagnosis not present

## 2020-10-19 DIAGNOSIS — C61 Malignant neoplasm of prostate: Secondary | ICD-10-CM | POA: Diagnosis not present

## 2020-10-19 DIAGNOSIS — E119 Type 2 diabetes mellitus without complications: Secondary | ICD-10-CM | POA: Diagnosis not present

## 2020-10-19 DIAGNOSIS — B351 Tinea unguium: Secondary | ICD-10-CM | POA: Diagnosis not present

## 2020-10-19 DIAGNOSIS — D649 Anemia, unspecified: Secondary | ICD-10-CM | POA: Diagnosis not present

## 2020-11-22 ENCOUNTER — Other Ambulatory Visit: Payer: Self-pay

## 2020-11-23 ENCOUNTER — Other Ambulatory Visit: Payer: Self-pay

## 2020-11-23 ENCOUNTER — Other Ambulatory Visit: Payer: Medicare HMO

## 2020-11-23 DIAGNOSIS — C61 Malignant neoplasm of prostate: Secondary | ICD-10-CM | POA: Diagnosis not present

## 2020-11-24 LAB — PSA: Prostate Specific Ag, Serum: 0.7 ng/mL (ref 0.0–4.0)

## 2020-11-26 ENCOUNTER — Telehealth: Payer: Medicare HMO

## 2020-11-26 NOTE — Telephone Encounter (Signed)
-----   Message from Abbie Sons, MD sent at 11/24/2020 10:32 AM EDT ----- PSA stable at 0.7.  Follow-up as scheduled

## 2020-11-26 NOTE — Telephone Encounter (Signed)
[place] notification sent 

## 2021-01-14 DIAGNOSIS — E7849 Other hyperlipidemia: Secondary | ICD-10-CM | POA: Diagnosis not present

## 2021-01-14 DIAGNOSIS — E119 Type 2 diabetes mellitus without complications: Secondary | ICD-10-CM | POA: Diagnosis not present

## 2021-01-14 DIAGNOSIS — I10 Essential (primary) hypertension: Secondary | ICD-10-CM | POA: Diagnosis not present

## 2021-01-21 DIAGNOSIS — I10 Essential (primary) hypertension: Secondary | ICD-10-CM | POA: Diagnosis not present

## 2021-01-21 DIAGNOSIS — E1169 Type 2 diabetes mellitus with other specified complication: Secondary | ICD-10-CM | POA: Diagnosis not present

## 2021-01-21 DIAGNOSIS — C61 Malignant neoplasm of prostate: Secondary | ICD-10-CM | POA: Diagnosis not present

## 2021-01-21 DIAGNOSIS — B351 Tinea unguium: Secondary | ICD-10-CM | POA: Diagnosis not present

## 2021-04-16 DIAGNOSIS — E119 Type 2 diabetes mellitus without complications: Secondary | ICD-10-CM | POA: Diagnosis not present

## 2021-04-16 DIAGNOSIS — H402231 Chronic angle-closure glaucoma, bilateral, mild stage: Secondary | ICD-10-CM | POA: Diagnosis not present

## 2021-04-17 DIAGNOSIS — E119 Type 2 diabetes mellitus without complications: Secondary | ICD-10-CM | POA: Diagnosis not present

## 2021-04-17 DIAGNOSIS — I10 Essential (primary) hypertension: Secondary | ICD-10-CM | POA: Diagnosis not present

## 2021-04-24 DIAGNOSIS — I10 Essential (primary) hypertension: Secondary | ICD-10-CM | POA: Diagnosis not present

## 2021-04-24 DIAGNOSIS — C61 Malignant neoplasm of prostate: Secondary | ICD-10-CM | POA: Diagnosis not present

## 2021-04-24 DIAGNOSIS — B351 Tinea unguium: Secondary | ICD-10-CM | POA: Diagnosis not present

## 2021-04-24 DIAGNOSIS — E1169 Type 2 diabetes mellitus with other specified complication: Secondary | ICD-10-CM | POA: Diagnosis not present

## 2021-04-24 DIAGNOSIS — E785 Hyperlipidemia, unspecified: Secondary | ICD-10-CM | POA: Diagnosis not present

## 2021-04-24 DIAGNOSIS — D649 Anemia, unspecified: Secondary | ICD-10-CM | POA: Diagnosis not present

## 2021-04-24 DIAGNOSIS — E119 Type 2 diabetes mellitus without complications: Secondary | ICD-10-CM | POA: Diagnosis not present

## 2021-04-30 DIAGNOSIS — E119 Type 2 diabetes mellitus without complications: Secondary | ICD-10-CM | POA: Diagnosis not present

## 2021-04-30 DIAGNOSIS — L600 Ingrowing nail: Secondary | ICD-10-CM | POA: Diagnosis not present

## 2021-04-30 DIAGNOSIS — L03031 Cellulitis of right toe: Secondary | ICD-10-CM | POA: Diagnosis not present

## 2021-04-30 DIAGNOSIS — M79674 Pain in right toe(s): Secondary | ICD-10-CM | POA: Diagnosis not present

## 2021-04-30 DIAGNOSIS — L603 Nail dystrophy: Secondary | ICD-10-CM | POA: Diagnosis not present

## 2021-04-30 DIAGNOSIS — M2141 Flat foot [pes planus] (acquired), right foot: Secondary | ICD-10-CM | POA: Diagnosis not present

## 2021-04-30 DIAGNOSIS — M2142 Flat foot [pes planus] (acquired), left foot: Secondary | ICD-10-CM | POA: Diagnosis not present

## 2021-04-30 DIAGNOSIS — M2011 Hallux valgus (acquired), right foot: Secondary | ICD-10-CM | POA: Diagnosis not present

## 2021-04-30 DIAGNOSIS — M79675 Pain in left toe(s): Secondary | ICD-10-CM | POA: Diagnosis not present

## 2021-05-24 DIAGNOSIS — L03031 Cellulitis of right toe: Secondary | ICD-10-CM | POA: Diagnosis not present

## 2021-05-24 DIAGNOSIS — L600 Ingrowing nail: Secondary | ICD-10-CM | POA: Diagnosis not present

## 2021-05-24 DIAGNOSIS — E119 Type 2 diabetes mellitus without complications: Secondary | ICD-10-CM | POA: Diagnosis not present

## 2021-05-30 ENCOUNTER — Other Ambulatory Visit: Payer: Self-pay

## 2021-05-30 DIAGNOSIS — C61 Malignant neoplasm of prostate: Secondary | ICD-10-CM

## 2021-05-31 ENCOUNTER — Other Ambulatory Visit: Payer: Medicare HMO

## 2021-05-31 ENCOUNTER — Other Ambulatory Visit: Payer: Self-pay

## 2021-05-31 DIAGNOSIS — C61 Malignant neoplasm of prostate: Secondary | ICD-10-CM

## 2021-06-01 LAB — PSA: Prostate Specific Ag, Serum: 0.8 ng/mL (ref 0.0–4.0)

## 2021-06-03 ENCOUNTER — Encounter: Payer: Self-pay | Admitting: Urology

## 2021-06-03 ENCOUNTER — Other Ambulatory Visit: Payer: Self-pay

## 2021-06-03 ENCOUNTER — Ambulatory Visit: Payer: Medicare HMO | Admitting: Urology

## 2021-06-03 VITALS — BP 176/77 | HR 85 | Ht 64.0 in | Wt 195.0 lb

## 2021-06-03 DIAGNOSIS — C61 Malignant neoplasm of prostate: Secondary | ICD-10-CM

## 2021-06-03 NOTE — Progress Notes (Signed)
06/03/2021 11:19 AM   Nicholas Schneider [birth year] [id number]  Referring provider: Adin Hector, MD [facility] [place],  [place] [address]  Chief Complaint  Patient presents with   Prostate Cancer    Urologic history: 1.  Adenocarcinoma prostate             - Radical retropubic prostatectomy 02/2006             - pT2c N0 M0 adenocarcinoma prostate, Gleason 3+4             - No extracapsular extension however positive margin at apex             - Detectable PSA postop treated adjuvant IMRT             - PSA undetectable until 2011             - CT abdomen pelvis 05/2017; no abnormalities  HPI: 76 y.o. male presents for annual follow-up.  Doing well since last visit No bothersome LUTS Denies dysuria, gross hematuria Denies flank, abdominal or pelvic pain PSA 05/31/2021 stable at 0.8  PMH: Past Medical History:  Diagnosis Date   Allergic rhinitis    Anemia    Cancer (HCC)    Chest pain of uncertain etiology    Diabetes mellitus without complication ([place])    Hyperlipidemia    Hypertension    Lipoma of arm    Recurrent sinusitis     Surgical History: Past Surgical History:  Procedure Laterality Date   COLONOSCOPY     COLONOSCOPY WITH PROPOFOL N/A 04/22/2017   Procedure: COLONOSCOPY WITH PROPOFOL;  Surgeon: Manya Silvas, MD;  Location: [facility] ENDOSCOPY;  Service: Endoscopy;  Laterality: N/A;   PROSTATE SURGERY      Home Medications:  Allergies as of 06/03/2021   No Known Allergies      Medication List        Accurate as of June 03, 2021 11:19 AM. If you have any questions, ask your nurse or doctor.          aspirin 81 MG EC tablet Take 81 mg by mouth daily. Swallow whole.   azelastine 0.1 % nasal spray Commonly known as: ASTELIN Place 1 spray into both nostrils 2 (two) times daily. Use in each nostril as directed   cloNIDine 0.1 MG tablet Commonly known as: CATAPRES Take 0.1 mg by mouth 2 (two) times daily.    dorzolamide-timolol 22.3-6.8 MG/ML ophthalmic solution Commonly known as: COSOPT   empagliflozin 10 MG Tabs tablet Commonly known as: JARDIANCE Take by mouth.   glipiZIDE 10 MG 24 hr tablet Commonly known as: GLUCOTROL XL   glucose blood test strip USE THREE TIMES DAILY AS INSTRUCTED   hydrochlorothiazide 25 MG tablet Commonly known as: HYDRODIURIL Take 25 mg by mouth daily.   latanoprost 0.005 % ophthalmic solution Commonly known as: XALATAN   metFORMIN 1000 MG tablet Commonly known as: GLUCOPHAGE Take 1,000 mg by mouth 2 (two) times daily with a meal.   multivitamin tablet Take 1 tablet by mouth daily.   quinapril 20 MG tablet Commonly known as: ACCUPRIL Take 20 mg by mouth 2 (two) times daily.   simvastatin 20 MG tablet Commonly known as: ZOCOR Take 20 mg by mouth daily.   tadalafil 20 MG tablet Commonly known as: CIALIS 1 tab 1 hour prior to intercourse   timolol 0.5 % ophthalmic solution Commonly known as: TIMOPTIC  Allergies: No Known Allergies  Family History: History reviewed. No pertinent family history.  Social History:  reports that he quit smoking about 30 years ago. His smoking use included cigarettes. He has never used smokeless tobacco. He reports that he does not drink alcohol and does not use drugs.   Physical Exam: BP (!) 176/77    Pulse 85    Ht 5\' 4"  (1.626 m)    Wt 195 lb (88.5 kg)    BMI 33.47 kg/m   Constitutional:  Alert and oriented, No acute distress. HEENT: [place] AT, moist mucus membranes.  Trachea midline, no masses. Cardiovascular: No clubbing, cyanosis, or edema. Respiratory: Normal respiratory effort, no increased work of breathing. GI: Abdomen is soft, nontender, nondistended, no abdominal masses GU: No CVA tenderness Skin: No rashes, bruises or suspicious lesions. Neurologic: Grossly intact, no focal deficits, moving all 4 extremities. Psychiatric: Normal mood and affect.  Assessment & Plan:    1.  Prostate  cancer Status post radical prostatectomy/adjuvant IMRT for + margin Biochemical recurrence PSA stable last 12 months with no increase Continue semiannual monitoring   Abbie Sons, MD  [facility] [address], [place], [place] [address] [phone]

## 2021-06-05 DIAGNOSIS — Z0100 Encounter for examination of eyes and vision without abnormal findings: Secondary | ICD-10-CM | POA: Diagnosis not present

## 2021-06-09 ENCOUNTER — Encounter: Payer: Self-pay | Admitting: Urology

## 2021-10-14 DIAGNOSIS — D649 Anemia, unspecified: Secondary | ICD-10-CM | POA: Diagnosis not present

## 2021-10-14 DIAGNOSIS — E119 Type 2 diabetes mellitus without complications: Secondary | ICD-10-CM | POA: Diagnosis not present

## 2021-10-14 DIAGNOSIS — E7849 Other hyperlipidemia: Secondary | ICD-10-CM | POA: Diagnosis not present

## 2021-10-18 DIAGNOSIS — H402231 Chronic angle-closure glaucoma, bilateral, mild stage: Secondary | ICD-10-CM | POA: Diagnosis not present

## 2021-10-22 DIAGNOSIS — B351 Tinea unguium: Secondary | ICD-10-CM | POA: Diagnosis not present

## 2021-10-22 DIAGNOSIS — Z1389 Encounter for screening for other disorder: Secondary | ICD-10-CM | POA: Diagnosis not present

## 2021-10-22 DIAGNOSIS — D649 Anemia, unspecified: Secondary | ICD-10-CM | POA: Diagnosis not present

## 2021-10-22 DIAGNOSIS — Z0000 Encounter for general adult medical examination without abnormal findings: Secondary | ICD-10-CM | POA: Diagnosis not present

## 2021-10-22 DIAGNOSIS — I10 Essential (primary) hypertension: Secondary | ICD-10-CM | POA: Diagnosis not present

## 2021-10-22 DIAGNOSIS — C61 Malignant neoplasm of prostate: Secondary | ICD-10-CM | POA: Diagnosis not present

## 2021-10-22 DIAGNOSIS — E119 Type 2 diabetes mellitus without complications: Secondary | ICD-10-CM | POA: Diagnosis not present

## 2021-12-02 ENCOUNTER — Other Ambulatory Visit: Payer: Medicare HMO

## 2021-12-02 DIAGNOSIS — C61 Malignant neoplasm of prostate: Secondary | ICD-10-CM

## 2021-12-03 LAB — PSA: Prostate Specific Ag, Serum: 0.9 ng/mL (ref 0.0–4.0)

## 2021-12-04 IMAGING — PT NM PET TUM IMG SKULL BASE T - THIGH
1 of 9 series · 1 of 25 positions shown · non-contrast
Comparison: CT abdomen 06/11/2017

CLINICAL DATA: Prostate cancer status post prior prostatectomy with
rising PSA. Prostatectomy 9009. Radiation is therapy 9009.

EXAM:
NUCLEAR MEDICINE PET SKULL BASE TO THIGH
TECHNIQUE: 9.3 mCi F18 Piflufolastat (Pylarify) was injected intravenously.
Full-ring PET imaging was performed from the skull base to thigh
after the radiotracer. CT data was obtained and used for attenuation
correction and anatomic localization.

[Series 5: pet wb uncorrected (nac) · axial · 5.0mm · 4.07mm/px · 1 of 290 slices shown]
[im 193/290]
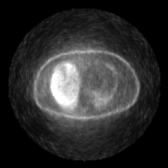

[1 of 25 positions shown; findings below may reference images not displayed]

FINDINGS: NECK

No radiotracer activity in neck lymph nodes.

Incidental CT finding: None

CHEST

No radiotracer accumulation within mediastinal or hilar lymph nodes.
No suspicious pulmonary nodules on the CT scan.

Incidental CT finding: Coronary artery calcification and aortic
atherosclerotic calcification.

ABDOMEN/PELVIS

Prostate: No focal activity in the prostate bed.

Lymph nodes: No abnormal radiotracer accumulation within pelvic or
abdominal nodes.

Liver: No evidence of liver metastasis

Incidental CT finding: Atherosclerotic calcification of the aorta.

SKELETON

No focal  activity to suggest skeletal metastasis.
IMPRESSION: 1. No evidence prostate cancer recurrence in the prostatectomy bed.
2. No metastatic adenopathy in the pelvis or periaortic
retroperitoneum.
3. No evidence of visceral metastasis or skeletal metastasis.

## 2021-12-05 ENCOUNTER — Encounter: Payer: Self-pay | Admitting: Urology

## 2022-01-02 ENCOUNTER — Other Ambulatory Visit: Payer: Self-pay | Admitting: *Deleted

## 2022-01-02 MED ORDER — TADALAFIL 20 MG PO TABS: ORAL_TABLET | ORAL | 6 refills | Status: AC

## 2022-01-21 DIAGNOSIS — E118 Type 2 diabetes mellitus with unspecified complications: Secondary | ICD-10-CM | POA: Diagnosis not present

## 2022-01-27 DIAGNOSIS — I10 Essential (primary) hypertension: Secondary | ICD-10-CM | POA: Diagnosis not present

## 2022-01-27 DIAGNOSIS — E119 Type 2 diabetes mellitus without complications: Secondary | ICD-10-CM | POA: Diagnosis not present

## 2022-01-27 DIAGNOSIS — C61 Malignant neoplasm of prostate: Secondary | ICD-10-CM | POA: Diagnosis not present

## 2022-01-27 DIAGNOSIS — B351 Tinea unguium: Secondary | ICD-10-CM | POA: Diagnosis not present

## 2022-01-27 DIAGNOSIS — Z23 Encounter for immunization: Secondary | ICD-10-CM | POA: Diagnosis not present

## 2022-01-27 DIAGNOSIS — T7840XA Allergy, unspecified, initial encounter: Secondary | ICD-10-CM | POA: Diagnosis not present

## 2022-01-27 DIAGNOSIS — E669 Obesity, unspecified: Secondary | ICD-10-CM | POA: Diagnosis not present

## 2022-01-27 DIAGNOSIS — D649 Anemia, unspecified: Secondary | ICD-10-CM | POA: Diagnosis not present

## 2022-02-10 DIAGNOSIS — H6123 Impacted cerumen, bilateral: Secondary | ICD-10-CM | POA: Diagnosis not present

## 2022-02-10 DIAGNOSIS — J310 Chronic rhinitis: Secondary | ICD-10-CM | POA: Diagnosis not present

## 2022-03-03 DIAGNOSIS — J310 Chronic rhinitis: Secondary | ICD-10-CM | POA: Diagnosis not present

## 2022-05-06 DIAGNOSIS — H402231 Chronic angle-closure glaucoma, bilateral, mild stage: Secondary | ICD-10-CM | POA: Diagnosis not present

## 2022-05-30 ENCOUNTER — Other Ambulatory Visit: Payer: Self-pay

## 2022-05-30 DIAGNOSIS — C61 Malignant neoplasm of prostate: Secondary | ICD-10-CM

## 2022-06-03 ENCOUNTER — Other Ambulatory Visit: Payer: Medicare HMO

## 2022-06-03 DIAGNOSIS — C61 Malignant neoplasm of prostate: Secondary | ICD-10-CM | POA: Diagnosis not present

## 2022-06-04 LAB — PSA: Prostate Specific Ag, Serum: 1 ng/mL (ref 0.0–4.0)

## 2022-06-05 ENCOUNTER — Encounter: Payer: Self-pay | Admitting: Urology

## 2022-06-05 ENCOUNTER — Ambulatory Visit: Payer: Medicare HMO | Admitting: Urology

## 2022-06-05 VITALS — BP 173/90 | HR 69 | Ht 64.0 in | Wt 196.0 lb

## 2022-06-05 DIAGNOSIS — C61 Malignant neoplasm of prostate: Secondary | ICD-10-CM

## 2022-06-05 NOTE — Progress Notes (Signed)
06/05/2022 10:38 AM   Nicholas Schneider 1946/03/06 [id number]  Referring provider: Adin Hector, MD [facility] [place],  [place] [address]  Chief Complaint  Patient presents with   Prostate Cancer    Urologic history: 1.  Adenocarcinoma prostate             - Radical retropubic prostatectomy 02/2006             - pT2c N0 M0 adenocarcinoma prostate, Gleason 3+4             - No extracapsular extension however + margin at apex             - Detectable PSA postop treated adjuvant IMRT             - PSA undetectable until 2011             - CT abdomen pelvis 05/2017; no abnormalities  HPI: 77 y.o. male presents for annual follow-up.  Doing well since last visit No bothersome LUTS Denies dysuria, gross hematuria Denies flank, abdominal or pelvic pain PSA 06/03/2022 was slightly increased at 1.0  PMH: Past Medical History:  Diagnosis Date   Allergic rhinitis    Anemia    Cancer (HCC)    Chest pain of uncertain etiology    Diabetes mellitus without complication ([place])    Hyperlipidemia    Hypertension    Lipoma of arm    Recurrent sinusitis     Surgical History: Past Surgical History:  Procedure Laterality Date   COLONOSCOPY     COLONOSCOPY WITH PROPOFOL N/A 04/22/2017   Procedure: COLONOSCOPY WITH PROPOFOL;  Surgeon: Manya Silvas, MD;  Location: [facility] ENDOSCOPY;  Service: Endoscopy;  Laterality: N/A;   PROSTATE SURGERY      Home Medications:  Allergies as of 06/05/2022   No Known Allergies      Medication List        Accurate as of June 05, 2022 10:38 AM. If you have any questions, ask your nurse or doctor.          aspirin EC 81 MG tablet Take 81 mg by mouth daily. Swallow whole.   azelastine 0.1 % nasal spray Commonly known as: ASTELIN Place 1 spray into both nostrils 2 (two) times daily. Use in each nostril as directed   cloNIDine 0.1 MG tablet Commonly known as: CATAPRES Take 0.1 mg by mouth 2 (two) times  daily.   dorzolamide-timolol 2-0.5 % ophthalmic solution Commonly known as: COSOPT   empagliflozin 10 MG Tabs tablet Commonly known as: JARDIANCE Take by mouth.   glipiZIDE 10 MG 24 hr tablet Commonly known as: GLUCOTROL XL   glucose blood test strip USE THREE TIMES DAILY AS INSTRUCTED   hydrochlorothiazide 25 MG tablet Commonly known as: HYDRODIURIL Take 25 mg by mouth daily.   latanoprost 0.005 % ophthalmic solution Commonly known as: XALATAN   metFORMIN 1000 MG tablet Commonly known as: GLUCOPHAGE Take 1,000 mg by mouth 2 (two) times daily with a meal.   multivitamin tablet Take 1 tablet by mouth daily.   quinapril 20 MG tablet Commonly known as: ACCUPRIL Take 20 mg by mouth 2 (two) times daily.   simvastatin 20 MG tablet Commonly known as: ZOCOR Take 20 mg by mouth daily.   tadalafil 20 MG tablet Commonly known as: CIALIS 1 tab 1 hour prior to intercourse   timolol 0.5 % ophthalmic solution Commonly known as: TIMOPTIC  Allergies: No Known Allergies  Family History: No family history on file.  Social History:  reports that he quit smoking about 31 years ago. His smoking use included cigarettes. He has never used smokeless tobacco. He reports that he does not drink alcohol and does not use drugs.   Physical Exam: BP (!) 173/90   Pulse 69   Ht '5\' 4"'$  (1.626 m)   Wt 196 lb (88.9 kg)   BMI 33.64 kg/m   Constitutional:  Alert, No acute distress. HEENT: [place] AT Respiratory: Normal respiratory effort, no increased work of breathing. Psychiatric: Normal mood and affect.  Assessment & Plan:    1.  Prostate cancer Status post radical prostatectomy/adjuvant IMRT for + margin Biochemical recurrence Slight PSA rise to 1.0 We discussed management options of continued surveillance.  PSMA/PET was also discussed.  He is status post radical prostatectomy and adjuvant radiation and we discussed if he had evidence of recurrent disease the next option would  be ADT.  Potential side effects were reviewed including tiredness, fatigue, hot flashes.  He is active and ADT would most likely affect his lifestyle.  He wanted to hold off on PSMA/PET at this time and has elected surveillance. 1 year follow-up with [place], MD  [facility] [address], [place], [place] [address] [phone]

## 2022-06-20 DIAGNOSIS — H401131 Primary open-angle glaucoma, bilateral, mild stage: Secondary | ICD-10-CM | POA: Diagnosis not present

## 2022-06-20 DIAGNOSIS — H2513 Age-related nuclear cataract, bilateral: Secondary | ICD-10-CM | POA: Diagnosis not present

## 2022-06-20 DIAGNOSIS — H402231 Chronic angle-closure glaucoma, bilateral, mild stage: Secondary | ICD-10-CM | POA: Diagnosis not present

## 2022-07-25 DIAGNOSIS — I10 Essential (primary) hypertension: Secondary | ICD-10-CM | POA: Diagnosis not present

## 2022-07-25 DIAGNOSIS — E118 Type 2 diabetes mellitus with unspecified complications: Secondary | ICD-10-CM | POA: Diagnosis not present

## 2022-07-25 DIAGNOSIS — D649 Anemia, unspecified: Secondary | ICD-10-CM | POA: Diagnosis not present

## 2022-07-25 DIAGNOSIS — E7849 Other hyperlipidemia: Secondary | ICD-10-CM | POA: Diagnosis not present

## 2022-08-01 DIAGNOSIS — I10 Essential (primary) hypertension: Secondary | ICD-10-CM | POA: Diagnosis not present

## 2022-08-01 DIAGNOSIS — D649 Anemia, unspecified: Secondary | ICD-10-CM | POA: Diagnosis not present

## 2022-08-01 DIAGNOSIS — B351 Tinea unguium: Secondary | ICD-10-CM | POA: Diagnosis not present

## 2022-08-01 DIAGNOSIS — E119 Type 2 diabetes mellitus without complications: Secondary | ICD-10-CM | POA: Diagnosis not present

## 2022-08-05 DIAGNOSIS — H402231 Chronic angle-closure glaucoma, bilateral, mild stage: Secondary | ICD-10-CM | POA: Diagnosis not present

## 2022-08-05 DIAGNOSIS — H2513 Age-related nuclear cataract, bilateral: Secondary | ICD-10-CM | POA: Diagnosis not present

## 2022-08-05 DIAGNOSIS — H401131 Primary open-angle glaucoma, bilateral, mild stage: Secondary | ICD-10-CM | POA: Diagnosis not present

## 2022-09-01 DIAGNOSIS — H6121 Impacted cerumen, right ear: Secondary | ICD-10-CM | POA: Diagnosis not present

## 2022-09-01 DIAGNOSIS — J302 Other seasonal allergic rhinitis: Secondary | ICD-10-CM | POA: Diagnosis not present

## 2022-09-02 DIAGNOSIS — H402231 Chronic angle-closure glaucoma, bilateral, mild stage: Secondary | ICD-10-CM | POA: Diagnosis not present

## 2022-09-02 DIAGNOSIS — H401131 Primary open-angle glaucoma, bilateral, mild stage: Secondary | ICD-10-CM | POA: Diagnosis not present

## 2022-09-02 DIAGNOSIS — H2513 Age-related nuclear cataract, bilateral: Secondary | ICD-10-CM | POA: Diagnosis not present

## 2022-12-16 DIAGNOSIS — D649 Anemia, unspecified: Secondary | ICD-10-CM | POA: Diagnosis not present

## 2022-12-16 DIAGNOSIS — E118 Type 2 diabetes mellitus with unspecified complications: Secondary | ICD-10-CM | POA: Diagnosis not present

## 2022-12-23 DIAGNOSIS — E119 Type 2 diabetes mellitus without complications: Secondary | ICD-10-CM | POA: Diagnosis not present

## 2022-12-23 DIAGNOSIS — E785 Hyperlipidemia, unspecified: Secondary | ICD-10-CM | POA: Diagnosis not present

## 2022-12-23 DIAGNOSIS — Z6830 Body mass index (BMI) 30.0-30.9, adult: Secondary | ICD-10-CM | POA: Diagnosis not present

## 2022-12-23 DIAGNOSIS — D649 Anemia, unspecified: Secondary | ICD-10-CM | POA: Diagnosis not present

## 2022-12-23 DIAGNOSIS — E669 Obesity, unspecified: Secondary | ICD-10-CM | POA: Diagnosis not present

## 2022-12-23 DIAGNOSIS — I10 Essential (primary) hypertension: Secondary | ICD-10-CM | POA: Diagnosis not present

## 2022-12-23 DIAGNOSIS — Z1331 Encounter for screening for depression: Secondary | ICD-10-CM | POA: Diagnosis not present

## 2022-12-23 DIAGNOSIS — B351 Tinea unguium: Secondary | ICD-10-CM | POA: Diagnosis not present

## 2022-12-23 DIAGNOSIS — Z0000 Encounter for general adult medical examination without abnormal findings: Secondary | ICD-10-CM | POA: Diagnosis not present

## 2023-03-03 DIAGNOSIS — Z860101 Personal history of adenomatous and serrated colon polyps: Secondary | ICD-10-CM | POA: Diagnosis not present

## 2023-03-03 DIAGNOSIS — K5903 Drug induced constipation: Secondary | ICD-10-CM | POA: Diagnosis not present

## 2023-03-03 DIAGNOSIS — K649 Unspecified hemorrhoids: Secondary | ICD-10-CM | POA: Diagnosis not present

## 2023-03-19 DIAGNOSIS — E118 Type 2 diabetes mellitus with unspecified complications: Secondary | ICD-10-CM | POA: Diagnosis not present

## 2023-03-19 DIAGNOSIS — D649 Anemia, unspecified: Secondary | ICD-10-CM | POA: Diagnosis not present

## 2023-03-19 DIAGNOSIS — E7849 Other hyperlipidemia: Secondary | ICD-10-CM | POA: Diagnosis not present

## 2023-03-26 DIAGNOSIS — B351 Tinea unguium: Secondary | ICD-10-CM | POA: Diagnosis not present

## 2023-03-26 DIAGNOSIS — E119 Type 2 diabetes mellitus without complications: Secondary | ICD-10-CM | POA: Diagnosis not present

## 2023-03-26 DIAGNOSIS — E785 Hyperlipidemia, unspecified: Secondary | ICD-10-CM | POA: Diagnosis not present

## 2023-03-26 DIAGNOSIS — I10 Essential (primary) hypertension: Secondary | ICD-10-CM | POA: Diagnosis not present

## 2023-05-18 ENCOUNTER — Ambulatory Visit: Payer: Medicare HMO

## 2023-05-18 DIAGNOSIS — K641 Second degree hemorrhoids: Secondary | ICD-10-CM | POA: Diagnosis not present

## 2023-05-18 DIAGNOSIS — K5730 Diverticulosis of large intestine without perforation or abscess without bleeding: Secondary | ICD-10-CM | POA: Diagnosis not present

## 2023-05-18 DIAGNOSIS — Z860101 Personal history of adenomatous and serrated colon polyps: Secondary | ICD-10-CM | POA: Diagnosis not present

## 2023-05-18 DIAGNOSIS — K640 First degree hemorrhoids: Secondary | ICD-10-CM | POA: Diagnosis not present

## 2023-05-18 DIAGNOSIS — Z09 Encounter for follow-up examination after completed treatment for conditions other than malignant neoplasm: Secondary | ICD-10-CM | POA: Diagnosis not present

## 2023-05-28 ENCOUNTER — Other Ambulatory Visit: Payer: Self-pay

## 2023-05-28 ENCOUNTER — Other Ambulatory Visit: Payer: Medicare HMO

## 2023-05-28 DIAGNOSIS — C61 Malignant neoplasm of prostate: Secondary | ICD-10-CM | POA: Diagnosis not present

## 2023-05-29 LAB — PSA: Prostate Specific Ag, Serum: 1.3 ng/mL (ref 0.0–4.0)

## 2023-06-01 ENCOUNTER — Ambulatory Visit: Payer: Medicare HMO | Admitting: Urology

## 2023-06-01 ENCOUNTER — Encounter: Payer: Self-pay | Admitting: Urology

## 2023-06-01 VITALS — BP 136/85 | Ht 64.0 in | Wt 187.0 lb

## 2023-06-01 DIAGNOSIS — R9721 Rising PSA following treatment for malignant neoplasm of prostate: Secondary | ICD-10-CM | POA: Diagnosis not present

## 2023-06-01 DIAGNOSIS — C61 Malignant neoplasm of prostate: Secondary | ICD-10-CM

## 2023-06-01 DIAGNOSIS — Z8546 Personal history of malignant neoplasm of prostate: Secondary | ICD-10-CM

## 2023-06-01 NOTE — Progress Notes (Signed)
 I, Maysun Anabel Bene, acting as a scribe for Riki Altes, MD., have documented all relevant documentation on the behalf of Riki Altes, MD, as directed by Riki Altes, MD while in the presence of Riki Altes, MD.  06/01/2023 11:37 AM   Haynes Bast 20-May-1945 [id number]  Referring provider: Lynnea Ferrier, MD [address] [facility] [place],  [place] [address]  Chief Complaint  Patient presents with   Prostate Cancer   Urologic history: 1.  Adenocarcinoma prostate Radical retropubic prostatectomy 02/2006 pT2c N0 M0 adenocarcinoma prostate, Gleason 3+4 No extracapsular extension however + margin at apex Detectable PSA postop treated adjuvant IMRT PSA undetectable until 2011 CT abdomen pelvis 05/2017; no abnormalities  HPI: BRYLEN WAGAR is a 78 y.o. male presents for annual follow-up.  Doing well since last visit No bothersome LUTS Denies dysuria, gross hematuria Denies flank, abdominal or pelvic pain PSA 05/28/2023 1.3  PSA trend   Prostate Specific Ag, Serum  Latest Ref Rng 0.0 - 4.0 ng/mL  11/26/2018 0.2   05/31/2019 0.4   05/29/2020 0.8   11/23/2020 0.7   05/31/2021 0.8   12/02/2021 0.9   06/03/2022 1.0   05/28/2023 1.3      PMH: Past Medical History:  Diagnosis Date   Allergic rhinitis    Anemia    Cancer (HCC)    Chest pain of uncertain etiology    Diabetes mellitus without complication (HCC)    Hyperlipidemia    Hypertension    Lipoma of arm    Recurrent sinusitis     Surgical History: Past Surgical History:  Procedure Laterality Date   COLONOSCOPY     COLONOSCOPY WITH PROPOFOL N/A 04/22/2017   Procedure: COLONOSCOPY WITH PROPOFOL;  Surgeon: Scot Jun, MD;  Location: [facility] ENDOSCOPY;  Service: Endoscopy;  Laterality: N/A;   PROSTATE SURGERY      Home Medications:  Allergies as of 06/01/2023   No Known Allergies      Medication List        Accurate as of June 01, 2023 11:37 AM. If you have any  questions, ask your nurse or doctor.          amLODipine 10 MG tablet Commonly known as: NORVASC Take 10 mg by mouth daily.   aspirin EC 81 MG tablet Take 81 mg by mouth daily. Swallow whole.   azelastine 0.1 % nasal spray Commonly known as: ASTELIN Place 1 spray into both nostrils 2 (two) times daily. Use in each nostril as directed   cloNIDine 0.1 MG tablet Commonly known as: CATAPRES Take 0.1 mg by mouth 2 (two) times daily.   dorzolamide-timolol 2-0.5 % ophthalmic solution Commonly known as: COSOPT   empagliflozin 10 MG Tabs tablet Commonly known as: JARDIANCE Take by mouth.   glipiZIDE 10 MG 24 hr tablet Commonly known as: GLUCOTROL XL   glucose blood test strip USE THREE TIMES DAILY AS INSTRUCTED   hydrochlorothiazide 25 MG tablet Commonly known as: HYDRODIURIL Take 25 mg by mouth daily.   latanoprost 0.005 % ophthalmic solution Commonly known as: XALATAN   metFORMIN 1000 MG tablet Commonly known as: GLUCOPHAGE Take 1,000 mg by mouth 2 (two) times daily with a meal.   multivitamin tablet Take 1 tablet by mouth daily.   potassium chloride 10 MEQ tablet Commonly known as: KLOR-CON Take by mouth.   quinapril 20 MG tablet Commonly known as: ACCUPRIL Take 20 mg by mouth 2 (two) times daily.  simvastatin 20 MG tablet Commonly known as: ZOCOR Take 20 mg by mouth daily.   tadalafil 20 MG tablet Commonly known as: CIALIS 1 tab 1 hour prior to intercourse   timolol 0.5 % ophthalmic solution Commonly known as: TIMOPTIC        Allergies: No Known Allergies  Social History:  reports that he quit smoking about 32 years ago. His smoking use included cigarettes. He has never used smokeless tobacco. He reports that he does not drink alcohol and does not use drugs.   Physical Exam: BP 136/85   Ht 5\' 4"  (1.626 m)   Wt 187 lb (84.8 kg)   BMI 32.10 kg/m   Constitutional:  Alert and oriented, No acute distress. HEENT: [place] AT, moist mucus membranes.   Trachea midline, no masses. Cardiovascular: No clubbing, cyanosis, or edema. Respiratory: Normal respiratory effort, no increased work of breathing. GI: Abdomen is soft, nontender, nondistended, no abdominal masses Skin: No rashes, bruises or suspicious lesions. Neurologic: Grossly intact, no focal deficits, moving all 4 extremities. Psychiatric: Normal mood and affect.   Assessment & Plan:    1. Prostate cancer Status post radical prostatectomy/adjuvant IMRT for + margin Biochemical recurrence PSA slightly higher at 1.3 At last year's visit, we discussed PSMA/PET and the possibility of ADT, however he is very active and did not desire antigen deprivation therapy. He would like to continue annual monitoring.  A 1 year follow-up with the PSA schedule.  [facility] [address] [place], [place] [address] [phone]

## 2023-06-05 ENCOUNTER — Other Ambulatory Visit: Payer: Medicare HMO

## 2023-06-08 ENCOUNTER — Ambulatory Visit: Payer: Medicare HMO | Admitting: Urology

## 2023-06-18 DIAGNOSIS — D649 Anemia, unspecified: Secondary | ICD-10-CM | POA: Diagnosis not present

## 2023-06-18 DIAGNOSIS — E118 Type 2 diabetes mellitus with unspecified complications: Secondary | ICD-10-CM | POA: Diagnosis not present

## 2023-06-18 DIAGNOSIS — C61 Malignant neoplasm of prostate: Secondary | ICD-10-CM | POA: Diagnosis not present

## 2023-06-18 DIAGNOSIS — E7849 Other hyperlipidemia: Secondary | ICD-10-CM | POA: Diagnosis not present

## 2023-06-25 DIAGNOSIS — E119 Type 2 diabetes mellitus without complications: Secondary | ICD-10-CM | POA: Diagnosis not present

## 2023-06-25 DIAGNOSIS — D649 Anemia, unspecified: Secondary | ICD-10-CM | POA: Diagnosis not present

## 2023-06-25 DIAGNOSIS — E785 Hyperlipidemia, unspecified: Secondary | ICD-10-CM | POA: Diagnosis not present

## 2023-06-25 DIAGNOSIS — I10 Essential (primary) hypertension: Secondary | ICD-10-CM | POA: Diagnosis not present

## 2023-12-25 DIAGNOSIS — E118 Type 2 diabetes mellitus with unspecified complications: Secondary | ICD-10-CM | POA: Diagnosis not present

## 2023-12-25 DIAGNOSIS — D649 Anemia, unspecified: Secondary | ICD-10-CM | POA: Diagnosis not present

## 2023-12-25 DIAGNOSIS — E7849 Other hyperlipidemia: Secondary | ICD-10-CM | POA: Diagnosis not present

## 2024-01-01 DIAGNOSIS — I10 Essential (primary) hypertension: Secondary | ICD-10-CM | POA: Diagnosis not present

## 2024-01-01 DIAGNOSIS — E785 Hyperlipidemia, unspecified: Secondary | ICD-10-CM | POA: Diagnosis not present

## 2024-01-01 DIAGNOSIS — C61 Malignant neoplasm of prostate: Secondary | ICD-10-CM | POA: Diagnosis not present

## 2024-01-01 DIAGNOSIS — B351 Tinea unguium: Secondary | ICD-10-CM | POA: Diagnosis not present

## 2024-01-01 DIAGNOSIS — Z1331 Encounter for screening for depression: Secondary | ICD-10-CM | POA: Diagnosis not present

## 2024-01-01 DIAGNOSIS — E11628 Type 2 diabetes mellitus with other skin complications: Secondary | ICD-10-CM | POA: Diagnosis not present

## 2024-01-01 DIAGNOSIS — Z0000 Encounter for general adult medical examination without abnormal findings: Secondary | ICD-10-CM | POA: Diagnosis not present

## 2024-02-16 DIAGNOSIS — H401131 Primary open-angle glaucoma, bilateral, mild stage: Secondary | ICD-10-CM | POA: Diagnosis not present

## 2024-02-16 DIAGNOSIS — H2513 Age-related nuclear cataract, bilateral: Secondary | ICD-10-CM | POA: Diagnosis not present

## 2024-02-16 DIAGNOSIS — E119 Type 2 diabetes mellitus without complications: Secondary | ICD-10-CM | POA: Diagnosis not present

## 2024-03-23 ENCOUNTER — Encounter: Payer: Self-pay | Admitting: Urology

## 2024-05-30 ENCOUNTER — Other Ambulatory Visit: Payer: Medicare HMO

## 2024-06-01 ENCOUNTER — Ambulatory Visit: Payer: Medicare HMO | Admitting: Urology

## 2024-06-08 ENCOUNTER — Ambulatory Visit: Admitting: Urology
